# Patient Record
Sex: Male | Born: 1935 | Race: White | Hispanic: No | Marital: Married | Smoking: Former smoker
Health system: Southern US, Community
[De-identification: ages and names within clinical notes are randomized; demographics above are authoritative.]

## PROBLEM LIST (undated history)

## (undated) DIAGNOSIS — I4891 Unspecified atrial fibrillation: Secondary | ICD-10-CM

## (undated) DIAGNOSIS — I272 Pulmonary hypertension, unspecified: Secondary | ICD-10-CM

## (undated) DIAGNOSIS — R609 Edema, unspecified: Secondary | ICD-10-CM

## (undated) DIAGNOSIS — I350 Nonrheumatic aortic (valve) stenosis: Secondary | ICD-10-CM

## (undated) DIAGNOSIS — R9430 Abnormal result of cardiovascular function study, unspecified: Secondary | ICD-10-CM

## (undated) DIAGNOSIS — I340 Nonrheumatic mitral (valve) insufficiency: Secondary | ICD-10-CM

## (undated) DIAGNOSIS — IMO0002 Reserved for concepts with insufficient information to code with codable children: Secondary | ICD-10-CM

## (undated) DIAGNOSIS — E785 Hyperlipidemia, unspecified: Secondary | ICD-10-CM

## (undated) DIAGNOSIS — R55 Syncope and collapse: Secondary | ICD-10-CM

## (undated) DIAGNOSIS — R0989 Other specified symptoms and signs involving the circulatory and respiratory systems: Secondary | ICD-10-CM

## (undated) DIAGNOSIS — I447 Left bundle-branch block, unspecified: Secondary | ICD-10-CM

## (undated) DIAGNOSIS — I4892 Unspecified atrial flutter: Secondary | ICD-10-CM

## (undated) DIAGNOSIS — I10 Essential (primary) hypertension: Secondary | ICD-10-CM

## (undated) DIAGNOSIS — Z950 Presence of cardiac pacemaker: Secondary | ICD-10-CM

## (undated) DIAGNOSIS — H9190 Unspecified hearing loss, unspecified ear: Secondary | ICD-10-CM

## (undated) DIAGNOSIS — N189 Chronic kidney disease, unspecified: Secondary | ICD-10-CM

## (undated) DIAGNOSIS — Z9229 Personal history of other drug therapy: Secondary | ICD-10-CM

## (undated) DIAGNOSIS — E039 Hypothyroidism, unspecified: Secondary | ICD-10-CM

## (undated) DIAGNOSIS — I5022 Chronic systolic (congestive) heart failure: Secondary | ICD-10-CM

## (undated) HISTORY — DX: Nonrheumatic aortic (valve) stenosis: I35.0

## (undated) HISTORY — DX: Abnormal result of cardiovascular function study, unspecified: R94.30

## (undated) HISTORY — DX: Chronic kidney disease, unspecified: N18.9

## (undated) HISTORY — DX: Syncope and collapse: R55

## (undated) HISTORY — DX: Reserved for concepts with insufficient information to code with codable children: IMO0002

## (undated) HISTORY — DX: Pulmonary hypertension, unspecified: I27.20

## (undated) HISTORY — DX: Personal history of other drug therapy: Z92.29

## (undated) HISTORY — DX: Presence of cardiac pacemaker: Z95.0

## (undated) HISTORY — DX: Hyperlipidemia, unspecified: E78.5

## (undated) HISTORY — DX: Nonrheumatic mitral (valve) insufficiency: I34.0

## (undated) HISTORY — DX: Other specified symptoms and signs involving the circulatory and respiratory systems: R09.89

## (undated) HISTORY — DX: Edema, unspecified: R60.9

## (undated) HISTORY — DX: Chronic systolic (congestive) heart failure: I50.22

## (undated) HISTORY — DX: Essential (primary) hypertension: I10

## (undated) HISTORY — PX: INGUINAL HERNIA REPAIR: SUR1180

## (undated) HISTORY — PX: MASTOIDECTOMY: SHX711

## (undated) HISTORY — DX: Hypothyroidism, unspecified: E03.9

## (undated) HISTORY — DX: Left bundle-branch block, unspecified: I44.7

## (undated) HISTORY — DX: Unspecified atrial fibrillation: I48.91

## (undated) HISTORY — DX: Unspecified atrial flutter: I48.92

---
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
## 2001-12-30 ENCOUNTER — Encounter: Payer: Self-pay | Admitting: Family Medicine

## 2001-12-30 ENCOUNTER — Ambulatory Visit (HOSPITAL_COMMUNITY): Admission: RE | Admit: 2001-12-30 | Discharge: 2001-12-30 | Payer: Self-pay | Admitting: Family Medicine

## 2003-01-19 ENCOUNTER — Encounter: Payer: Self-pay | Admitting: Cardiology

## 2003-02-10 ENCOUNTER — Encounter: Payer: Self-pay | Admitting: Cardiology

## 2003-02-15 ENCOUNTER — Encounter: Payer: Self-pay | Admitting: Cardiology

## 2003-02-15 ENCOUNTER — Ambulatory Visit (HOSPITAL_COMMUNITY): Admission: RE | Admit: 2003-02-15 | Discharge: 2003-02-15 | Payer: Self-pay | Admitting: *Deleted

## 2003-02-16 ENCOUNTER — Encounter: Payer: Self-pay | Admitting: Cardiology

## 2003-03-03 ENCOUNTER — Ambulatory Visit (HOSPITAL_COMMUNITY): Admission: RE | Admit: 2003-03-03 | Discharge: 2003-03-03 | Payer: Self-pay | Admitting: Internal Medicine

## 2003-03-19 ENCOUNTER — Encounter (HOSPITAL_COMMUNITY): Admission: RE | Admit: 2003-03-19 | Discharge: 2003-04-18 | Payer: Self-pay | Admitting: Cardiology

## 2004-10-25 ENCOUNTER — Ambulatory Visit (HOSPITAL_COMMUNITY): Admission: RE | Admit: 2004-10-25 | Discharge: 2004-10-25 | Payer: Self-pay | Admitting: General Surgery

## 2007-09-01 ENCOUNTER — Ambulatory Visit (HOSPITAL_COMMUNITY): Admission: RE | Admit: 2007-09-01 | Discharge: 2007-09-01 | Payer: Self-pay | Admitting: Family Medicine

## 2007-09-08 ENCOUNTER — Ambulatory Visit: Payer: Self-pay | Admitting: Orthopedic Surgery

## 2007-09-08 DIAGNOSIS — M79609 Pain in unspecified limb: Secondary | ICD-10-CM | POA: Insufficient documentation

## 2010-05-05 ENCOUNTER — Encounter: Payer: Self-pay | Admitting: Cardiology

## 2010-05-05 ENCOUNTER — Ambulatory Visit (HOSPITAL_COMMUNITY): Admission: RE | Admit: 2010-05-05 | Discharge: 2010-05-05 | Payer: Self-pay | Admitting: Family Medicine

## 2010-05-09 ENCOUNTER — Encounter (INDEPENDENT_AMBULATORY_CARE_PROVIDER_SITE_OTHER): Payer: Self-pay | Admitting: Family Medicine

## 2010-05-09 ENCOUNTER — Ambulatory Visit (HOSPITAL_COMMUNITY): Admission: RE | Admit: 2010-05-09 | Discharge: 2010-05-09 | Payer: Self-pay | Admitting: Family Medicine

## 2010-05-09 ENCOUNTER — Ambulatory Visit: Payer: Self-pay | Admitting: Cardiology

## 2010-05-23 ENCOUNTER — Ambulatory Visit (HOSPITAL_COMMUNITY): Admission: RE | Admit: 2010-05-23 | Discharge: 2010-05-23 | Payer: Self-pay | Admitting: Family Medicine

## 2010-08-07 ENCOUNTER — Ambulatory Visit: Payer: Self-pay | Admitting: Cardiology

## 2010-08-07 ENCOUNTER — Encounter: Payer: Self-pay | Admitting: Physician Assistant

## 2010-08-07 DIAGNOSIS — E785 Hyperlipidemia, unspecified: Secondary | ICD-10-CM

## 2010-08-10 ENCOUNTER — Encounter (INDEPENDENT_AMBULATORY_CARE_PROVIDER_SITE_OTHER): Payer: Self-pay | Admitting: *Deleted

## 2010-09-06 ENCOUNTER — Ambulatory Visit: Payer: Self-pay | Admitting: Cardiology

## 2010-10-06 ENCOUNTER — Ambulatory Visit: Payer: Self-pay | Admitting: Cardiology

## 2010-10-06 ENCOUNTER — Encounter: Payer: Self-pay | Admitting: Cardiology

## 2010-10-13 ENCOUNTER — Encounter (INDEPENDENT_AMBULATORY_CARE_PROVIDER_SITE_OTHER): Payer: Self-pay | Admitting: *Deleted

## 2010-11-28 NOTE — Progress Notes (Signed)
Summary: Office Visit-DR. JEFFREY HARDEN  Office Visit-DR. JEFFREY HARDEN   Imported By: Zachary George 08/07/2010 10:46:55  _____________________________________________________________________  External Attachment:    Type:   Image     Comment:   External Document

## 2010-11-28 NOTE — Assessment & Plan Note (Signed)
Summary: 1 MO F/U PER 10/10 OV W/SERPE-JM   Visit Type:  Follow-up Primary Alonnie Bieker:  Dr Megan Mans  CC:  cardiomyopathy.  History of Present Illness: The patient is seen for followup of cardiomyopathy.  He was seen as a new patient on August 07, 2010. At that time he was noted that he had left bundle branch block.  Also he has an ejection fraction of 20%.  There is akinesis of the anteroseptal wall anterolateral wall and apex.  There is hypokinesis of the posterior lateral wall.  There was mild right ventricular dysfunction.  The patient was quite stable.  We decided to begin to adjust medications for his cardiomyopathy and evaluate him slowly over time.  Low-dose carvedilol was started.  He is doing well.  Also we checked labs.  Thyroid functions were normal.  BUN was 25 with creatinine 1.3 and an estimated GFR of 50.  Potassium is 4.3.  BNP was surprisingly low at 78  Preventive Screening-Counseling & Management  Alcohol-Tobacco     Smoking Status: quit     Year Quit: 1949  Current Medications (verified): 1)  Simvastatin 40 Mg Tabs (Simvastatin) .... Take 1 Tablet By Mouth Once A Day 2)  Furosemide 20 Mg Tabs (Furosemide) .... Take 1 Tablet By Mouth Once A Day 3)  Klor-Con M20 20 Meq Cr-Tabs (Potassium Chloride Crys Cr) .... Take 1 Tablet By Mouth Once A Day 4)  Tylenol 325 Mg Tabs (Acetaminophen) .... As Needed 5)  Aspirin 81 Mg Tbec (Aspirin) .... Take One Tablet By Mouth Daily 6)  Carvedilol 3.125 Mg Tabs (Carvedilol) .... Take One Tablet By Mouth Twice A Day  Allergies (verified): No Known Drug Allergies  Comments:  Nurse/Medical Assistant: The patient's medication list and allergies were reviewed with the patient and were updated in the Medication and Allergy Lists.  Past History:  Past Medical History: Hypertension Hyperlipidemia cardiomyopathy   EF 15-20%... echo... September, 2011 LBBB Edema  Social History: Smoking Status:  quit  Review of Systems   Patient denies fever, chills, headache, sweats, rash, change in vision, change in hearing, chest pain, cough, nausea vomiting, urinary symptoms.  All other systems are reviewed and are negative  Vital Signs:  Patient profile:   77 year old male Height:      66 inches Weight:      169 pounds Pulse rate:   69 / minute BP sitting:   124 / 76  (left arm) Cuff size:   regular  Vitals Entered By: Carlye Grippe (September 06, 2010 1:40 PM)  Physical Exam  General:  Patient looks good today. Eyes:  no xanthelasma. Neck:  no jugular venous distention. Lungs:  lungs are clear.  Respiratory effort is nonlabored. Heart:  cardiac exam reveals an S1-S2.  No clicks or significant murmurs. Abdomen:  abdomen is soft. Extremities:  trace peripheral edema. Psych:  patient is oriented to person time and place.  Affect is normal.   Impression & Recommendations:  Problem # 1:  EDEMA (ICD-782.3) Patient has trace edema today.  I will not change his diuretic.  Problem # 2:  HYPERTENSION (ICD-401.9)  His updated medication list for this problem includes:    Furosemide 20 Mg Tabs (Furosemide) .[name]... Take 1 tablet by mouth once a day    Aspirin 81 Mg Tbec (Aspirin) .[name]... Take one tablet by mouth daily    Carvedilol 3.125 Mg Tabs (Carvedilol) .[name]... Take one tablet by mouth twice a day    Altace 2.5 Mg Caps (  Ramipril) .[name]... Take 1 capsule by mouth once a day Blood pressure is controlled.  ACE Inhibitor we added for his LV dysfunction.  Problem # 3:  LEFT BUNDLE BRANCH BLOCK (ICD-426.3)  His updated medication list for this problem includes:    Aspirin 81 Mg Tbec (Aspirin) .[name]... Take one tablet by mouth daily    Carvedilol 3.125 Mg Tabs (Carvedilol) .[name]... Take one tablet by mouth twice a day    Altace 2.5 Mg Caps (Ramipril) .[name]... Take 1 capsule by mouth once a day Is possible that his left lateral planes a role with his left ventricular dysfunction.  Certainly it does not cause an EF of 20%.  Problem  # 4:  CHRONIC SYSTOLIC HEART FAILURE (ICD-428.22)  His updated medication list for this problem includes:    Furosemide 20 Mg Tabs (Furosemide) .[name]... Take 1 tablet by mouth once a day    Aspirin 81 Mg Tbec (Aspirin) .[name]... Take one tablet by mouth daily    Carvedilol 3.125 Mg Tabs (Carvedilol) .[name]... Take one tablet by mouth twice a day    Altace 2.5 Mg Caps (Ramipril) .[name]... Take 1 capsule by mouth once a day The patient's cardiopathy he is stable.  He is not having clinical significant heart failure.  We had a very low dose of carvedilol at the time of his last visit.  His renal function is stable enough to very low dose of an ACE inhibitor.  Because this is a new diagnosis since 2004 we have to consider coronary disease.  However the patient is a 77 years of age and is not having symptoms.  I will consider nuclear scan at a later date.  Patient Instructions: 1)  Follow up with Dr. [name] on Friday, October 06, 2010 at 1pm. 2)  Start Altace 2.5mg  by mouth once daily. Prescriptions: ALTACE 2.5 MG CAPS (RAMIPRIL) Take 1 capsule by mouth once a day  #30 x 6   Entered by:   Cyril Loosen, RN, BSN   Authorized by:   Talitha Givens, MD, [facility]   Signed by:   Cyril Loosen, RN, BSN on 09/06/2010   Method used:   Electronically to        [facility]   [address] ([facility]ail)       [address]       [place], [place]  [address]       Ph: [phone]       Fax: [phone]   RxID:   [id number]   Handout requested.

## 2010-11-28 NOTE — Letter (Signed)
Summary: Engineer, materials at [facility]  [address]   [place], [place] [address]   Phone: [phone]  Fax: [phone]        August 10, 2010 MRN: [id number]    [place] [address]    Dear Mr. Ranes,  Your test ordered by Selena Batten has been reviewed by your physician (or physician assistant) and was found to be normal or stable. Your physician (or physician assistant) felt no changes were needed at this time.  ____ Echocardiogram  ____ Cardiac Stress Test  _X___ Lab Work  ____ Peripheral vascular study of arms, legs or neck  ____ CT scan or X-ray  ____ Lung or Breathing test  ____ Other:   Thank you.   Cyril Loosen, RN, BSN    Duane Boston, M.D., F.A.C.C. Thressa Sheller, M.D., F.A.C.C. Oneal Grout, M.D., F.A.C.C. Cheree Ditto, M.D., F.A.C.C. Daiva Nakayama, M.D., F.A.C.C. Kenney Houseman, M.D., F.A.C.C. Jeanne Ivan, PA-C

## 2010-11-28 NOTE — Letter (Signed)
Summary: External Correspondence/ OFFICE VISIT DR. MCINNIS  External Correspondence/ OFFICE VISIT DR. MCINNIS   Imported By: Dorise Hiss 05/31/2010 11:49:38  _____________________________________________________________________  External Attachment:    Type:   Image     Comment:   External Document

## 2010-11-28 NOTE — Assessment & Plan Note (Signed)
Summary: np6/sob/edema/fatigue/mt   Visit Type:  Initial Consult Primary Provider:  Mc[facility]inness   History of Present Illness: patient presents to Dr. Willa Rough for initial consultation, following recent development of new onset peripheral edema and exertional dyspnea.  He presents with no documented history of CAD. He was briefly seen in our [place] clinic in 2004, when he presented with newly documented LBBB. 2-D echocardiography at that time suggested normal LVF, with no significant valvular abnormalities. An adenosine stress Cardiolite revealed normal perfusion; EF 65%.  Patient presents with cardiac risk factors notable for HTN, dyslipidemia, age, and remote tobacco smoking.  He was recently started on low-dose furosemide, with significant improvement in his edema. He states that his exertional symptoms improved, as well. He denies any history of exertional angina pectoris. He denies concomitant PND or orthopnea. Recent evaluation by CXR this past July indicated slight interstitial edema superimposed on emphysema and chronic long changes, such as idiopathic pulmonary fibrosis. Further evaluation with a high resolution CT scan was recommended.  A 2-D echo at that time, reviewed by Dr. Dietrich Pates, indicated severe LVD (EF 15-20%), and virtual AK of antero-septal/anterolateral and apical myocardium; moderate posterolateral wall HK; mild RV dysfunction; no significant valvular abnormalities.  Current Medications (verified): 1)  Simvastatin 40 Mg Tabs (Simvastatin) .... Take 1 Tablet By Mouth Once A Day 2)  Furosemide 20 Mg Tabs (Furosemide) .... Take 1 Tablet By Mouth Once A Day 3)  Klor-Con M20 20 Meq Cr-Tabs (Potassium Chloride Crys Cr) .... Take 1 Tablet By Mouth Once A Day 4)  Tylenol 325 Mg Tabs (Acetaminophen) .... As Needed  Allergies (verified): No Known Drug Allergies  Comments:  Nurse/Medical Assistant: The patient's medication list and allergies were reviewed with the  patient and were updated in the Medication and Allergy Lists.  Past History:  Past Medical History: Last updated: 08/07/2010 Hypertension Hyperlipidemia  Family History: noncontributory for premature CAD.  Social History: Retired from government quit smoking in 1952. Denies alcohol use.  Review of Systems       No fevers, chills, hemoptysis, dysphagia, melena, hematocheezia, hematuria, rash, claudication, orthopnea, pnd, pedal edema. denies tachycardia palpitations. All other systems negative.   Vital Signs:  Patient profile:   77 year old male Height:      66 inches Weight:      164 pounds BMI:     26.57 Pulse rate:   72 / minute BP sitting:   129 / 83  (left arm) Cuff size:   regular  Vitals Entered By: Carlye Grippe (August 07, 2010 2:14 PM)  Physical Exam  Additional Exam:  GEN: 77 year old male, sitting upright, in no distress HEENT: NCAT,PERRLA,EOMI NECK: palpable pulses, no bruits; no JVD; no TM LUNGS: bibasilar crackles, worse on right; no wheezes HEART: RRR (S1S2); no significant murmurs; no rubs; no S3 gallops ABD: soft, NT; intact BS EXT: intact distal pulses; 1+ pedal edema SKIN: warm, dry MUSC: no obvious deformity NEURO: A/O (x3)     EKG  Procedure date:  08/07/2010  Findings:      normal sinus rhythm at 83 bpm; LAD/LAH B.; chronic LBBB  Impression & Recommendations:  Problem # 1:  CHRONIC SYSTOLIC HEART FAILURE (ICD-428.22)  continue current low dose furosemide, given reported symptomatic improvement. We'll initiate medical therapy with carvedilol 3.125 mg b.i.d., and up titrate as blood pressure/heart rate allows. check baseline metabolic profile and BNP level. We'll consider adding low-dose ACE inhibitor at time of next office visit, pending review of renal function. Will start low  dose aspirin for primary prevention. Patient presents with newly documented severe LVD (EF  15-20%), and we will consider further evaluation, in the near  future, with perfusion imaging study for risk stratification, versus diagnostic coronary angiography to define coronary anatomy. Will schedule early clinic follow with myself and Dr. [name].  Problem # 2:  LEFT BUNDLE BRANCH BLOCK (ICD-426.3)  initially diagnosed in 2004. Subsequent normal nuclear imaging study.  Problem # 3:  HYPERTENSION (ICD-401.9) Assessment: Comment Only  Problem # 4:  DYSLIPIDEMIA (ICD-272.4) Assessment: Comment Only  Problem # 5:  EDEMA (ICD-782.3)  continue low dose furosemide  Other Orders: EKG w/ Interpretation ([address]) T-Basic Metabolic Panel ([address]) T-BNP  (B Natriuretic Peptide) ([address]-91478) T-TSH [phone])  Patient Instructions: 1)  Follow up with Dr. [name] on Wed, Sep 06, 2010 at 2pm. 2)  Your physician recommends that you go to the [facility] for lab work. 3)  Your physician discussed the risks, benefits and indications for preventive aspirin therapy. It is recommended that you start (or continue) taking 81 mg of aspirin a day. 4)  Start Coreg (Carvedilol) 3.125mg  by mouth two times a day. Prescriptions: CARVEDILOL 3.125 MG TABS (CARVEDILOL) Take one tablet by mouth twice a day  #60 x 6   Entered by:   Cyril Loosen, RN, BSN   Authorized by:   Nelida Meuse, PA-C   Signed by:   Cyril Loosen, RN, BSN on 08/07/2010   Method used:   Electronically to        [facility]  [place] [address] ([address]ail)       [address]       [place], [place]  [address]       Ph: [phone]       Fax: [phone]   RxID:   [id number]   Handout requested.  I have reviewed and approved all prescriptions at the time of the office visit. Nelida Meuse, PA-C  August 07, 2010 3:36 PM

## 2010-11-30 NOTE — Assessment & Plan Note (Signed)
Summary: 1 MO F/U PER REMINDER-JM   Visit Type:  Follow-up Primary Provider:  Dr Megan Mans  CC:  cardiomyopathy.  History of Present Illness: The patient is seen for followup of cardiomyopathy.  He has left ventricular dysfunction.  Up to this point I have not pushed for ischemia workup.  I have been adjusting his medicines very carefully.  I saw him last night, 011.  At that time we started a small does of altace. He is tolerating this well.  Preventive Screening-Counseling & Management  Alcohol-Tobacco     Smoking Status: quit     Year Quit: 1951  Current Medications (verified): 1)  Simvastatin 40 Mg Tabs (Simvastatin) .... Take 1 Tablet By Mouth Once A Day 2)  Furosemide 20 Mg Tabs (Furosemide) .... Take 1 Tablet By Mouth Once A Day 3)  Klor-Con M20 20 Meq Cr-Tabs (Potassium Chloride Crys Cr) .... Take 1 Tablet By Mouth Once A Day 4)  Tylenol 325 Mg Tabs (Acetaminophen) .... As Needed 5)  Aspirin 81 Mg Tbec (Aspirin) .... Take One Tablet By Mouth Daily 6)  Carvedilol 3.125 Mg Tabs (Carvedilol) .... Take One Tablet By Mouth Twice A Day 7)  Altace 2.5 Mg Caps (Ramipril) .... Take 1 Capsule By Mouth Once A Day  Allergies (verified): No Known Drug Allergies  Comments:  Nurse/Medical Assistant: The patient's medication list and allergies were reviewed with the patient and were updated in the Medication and Allergy Lists.  Past History:  Past Medical History: Hypertension Hyperlipidemia cardiomyopathy   EF 15-20%... echo... September, 2011 LBBB Edema .[name]  Review of Systems       Patient denies fever, chills, headache, sweats, rash, change in vision, change in hearing, chest pain, cough, nausea vomiting, urinary symptoms.  All the systems are reviewed and are negative.  Vital Signs:  Patient profile:   77 year old male Height:      66 inches Weight:      166 pounds Pulse rate:   50 / minute BP sitting:   131 / 78  (left arm) Cuff size:   regular  Vitals Entered By:  Carlye Grippe (October 06, 2010 12:47 PM)  Physical Exam  General:  he looks great today. Eyes:  no xanthelasma. Neck:  no jugular venous distention. Lungs:  lungs are clear.  Respiratory effort is nonlabored. Heart:  cardiac exam reveals an S1-S2.  No clicks or significant murmurs. Abdomen:  abdomen soft. Extremities:  no peripheral edema. Psych:  patient is oriented to person time and place.  Affect is normal.   Impression & Recommendations:  Problem # 1:  EDEMA (ICD-782.3) No edema today.  No change in therapy.  Problem # 2:  HYPERTENSION (ICD-401.9)  His updated medication list for this problem includes:    Furosemide 20 Mg Tabs (Furosemide) .[name]... Take 1 tablet by mouth once a day    Aspirin 81 Mg Tbec (Aspirin) .[name]... Take one tablet by mouth daily    Carvedilol 3.125 Mg Tabs (Carvedilol) .[name]... Take one tablet by mouth twice a day    Altace 5 Mg Caps (Ramipril) .[name]... Take 1 tablet by mouth once a day Blood pressure is well-controlled.  We will be increasing his Altace 4 CHF  Orders: T-Basic Metabolic Panel [phone])  Problem # 3:  CHRONIC SYSTOLIC HEART FAILURE (ICD-428.22)  His updated medication list for this problem includes:    Furosemide 20 Mg Tabs (Furosemide) .[name]... Take 1 tablet by mouth once a day    Aspirin 81 Mg  Tbec (Aspirin) .[name]... Take one tablet by mouth daily    Carvedilol 3.125 Mg Tabs (Carvedilol) .[name]... Take one tablet by mouth twice a day    Altace 5 Mg Caps (Ramipril) .[name]... Take 1 tablet by mouth once a day Clinically he is stable.  We will titrate up the dose of Altace.  His renal function will be checked today.  We'll see him back in several weeks.  Patient Instructions: 1)  Follow up with Dr. [name] on Friday, December 15, 2010 at 1:45pm. 2)  Increase Altace (ramipril) to 5mg  by mouth once daily. 3)  Your physician recommends that you go to the [facility] for lab work: DO TODAY. Prescriptions: ALTACE 5 MG CAPS (RAMIPRIL) Take 1 tablet by  mouth once a day  #30 x 6   Entered by:   Cyril Loosen, RN, BSN   Authorized by:   Talitha Givens, MD, [facility]   Signed by:   Cyril Loosen, RN, BSN on 10/06/2010   Method used:   Electronically to        [facility]  [address] (retail)       [address] [place] [address]       [place], [place]  [address]       Ph: [phone]       Fax: [phone]   RxID:   [id number]

## 2010-11-30 NOTE — Letter (Signed)
Summary: Engineer, materials at [facility]  [address]   [place], [place] [address]   Phone: [phone]  Fax: [phone]        October 13, 2010 MRN: [id number]   Javier Richards [address] [place], [place]  [address]    Dear [name],  Your test ordered by Selena Batten has been reviewed by your physician (or physician assistant) and was found to be normal or stable. Your physician (or physician assistant) felt no changes were needed at this time.  ____ Echocardiogram  ____ Cardiac Stress Test  _X___ Lab Work  ____ Peripheral vascular study of arms, legs or neck  ____ CT scan or X-ray  ____ Lung or Breathing test  ____ Other:   Thank you.   Cyril Loosen, RN, BSN    Duane Boston, M.D., F.A.C.C. Thressa Sheller, M.D., F.A.C.C. Oneal Grout, M.D., F.A.C.C. Cheree Ditto, M.D., F.A.C.C. Daiva Nakayama, M.D., F.A.C.C. Kenney Houseman, M.D., F.A.C.C. Jeanne Ivan, PA-C

## 2010-12-01 NOTE — Progress Notes (Signed)
Summary: Office Visit-dR. jEFFREY hARDEN  Office Visit-dR. jEFFREY hARDEN   Imported By: Zachary George 08/07/2010 10:47:51  _____________________________________________________________________  External Attachment:    Type:   Image     Comment:   External Document

## 2010-12-13 ENCOUNTER — Encounter: Payer: Self-pay | Admitting: Physician Assistant

## 2010-12-15 ENCOUNTER — Encounter: Payer: Self-pay | Admitting: Cardiology

## 2010-12-15 ENCOUNTER — Ambulatory Visit (INDEPENDENT_AMBULATORY_CARE_PROVIDER_SITE_OTHER): Payer: Medicare Other | Admitting: Cardiology

## 2010-12-15 DIAGNOSIS — I428 Other cardiomyopathies: Secondary | ICD-10-CM

## 2010-12-15 DIAGNOSIS — I6529 Occlusion and stenosis of unspecified carotid artery: Secondary | ICD-10-CM

## 2010-12-19 ENCOUNTER — Encounter: Payer: Self-pay | Admitting: Cardiology

## 2010-12-20 NOTE — Medication Information (Signed)
Summary: RX Investment banker, operational DRUG INFO  RX Folder/ [facility]   Imported By: Dorise Hiss 12/13/2010 08:30:24  _____________________________________________________________________  External Attachment:    Type:   Image     Comment:   External Document

## 2010-12-20 NOTE — Assessment & Plan Note (Addendum)
Summary: 2 MO F/U PER 12/9 OV-JM/TR   Visit Type:  Follow-up Primary Provider:  Dr Megan Mans  CC:  cardiopathy.  History of Present Illness: The patient is seen for followup of cardiomyopathy..  He's doing very well.  I saw him last December, 2011.  On that day we increased his Altace dose.  He is doing well with this.  Labs checked that day revealed a BUN of 26 and creatinine 1.1 with GFR greater than 60.  This is excellent for him.  Preventive Screening-Counseling & Management  Alcohol-Tobacco     Smoking Status: quit     Year Quit: 1949  Current Medications (verified): 1)  Simvastatin 40 Mg Tabs (Simvastatin) .... Take 1 Tablet By Mouth Once A Day 2)  Furosemide 20 Mg Tabs (Furosemide) .... Take 1 Tablet By Mouth Once A Day 3)  Klor-Con M20 20 Meq Cr-Tabs (Potassium Chloride Crys Cr) .... Take 1 Tablet By Mouth Once A Day 4)  Tylenol 325 Mg Tabs (Acetaminophen) .... As Needed 5)  Aspirin 81 Mg Tbec (Aspirin) .... Take One Tablet By Mouth Daily 6)  Carvedilol 3.125 Mg Tabs (Carvedilol) .... Take One Tablet By Mouth Twice A Day 7)  Altace 5 Mg Caps (Ramipril) .... Take 1 Tablet By Mouth Once A Day  Allergies (verified): No Known Drug Allergies  Comments:  Nurse/Medical Assistant: The patient's medication list and allergies were reviewed with the patient and were updated in the Medication and Allergy Lists.  Past History:  Past Medical History: Hypertension Hyperlipidemia cardiomyopathy   EF 15-20%... echo... September, 2011 LBBB Edema.. Carotid bruit   right... February, 201 to  Review of Systems       Patient denies fever, chills, headache, sweats, rash, change in vision, change in hearing, chest pain, cough, nausea vomiting, urinary symptoms.  All other systems are reviewed and are negative.  Vital Signs:  Patient profile:   77 year old male Height:      66 inches Weight:      165 pounds Pulse rate:   70 / minute BP sitting:   141 / 79  (left arm) Cuff size:    regular  Vitals Entered By: Carlye Grippe (December 15, 2010 1:54 PM)  Physical Exam  General:  patient is quite stable. Head:  head is atraumatic. Eyes:  no xanthelasma. Neck:  no jugular distention. there is a soft right carotid bruit. Chest Wall:  no chest wall tenderness. Lungs:  lungs are clear.  Respiratory effort is unlabored. Heart:  cardiac exam reveals S1-S2 present no clicks or significant murmurs. Abdomen:  abdomen soft. Msk:  no musculoskeletal deformities. Extremities:  no peripheral edema. Skin:  no skin rashes. Psych:  patient is oriented to person time and place.  Affect is normal.   Impression & Recommendations:  Problem # 1:  * CAROTID BRUIT There is a soft carotid bruit.  I reviewed the records carefully and there is no carotid Doppler documented.  This will be ordered and we'll be in touch with the information.  Problem # 2:  EDEMA (ICD-782.3) He has no edema today.  No change in his diuretic dose.  Problem # 3:  DYSLIPIDEMIA (ICD-272.4)  His updated medication list for this problem includes:    Simvastatin 40 Mg Tabs (Simvastatin) .[name]... Take 1 tablet by mouth once a day Lipids are being treated.  No change in therapy.  Problem # 4:  HYPERTENSION (ICD-401.9) Blood pressure is controlled.  No change in therapy.  Problem # 5:  CHRONIC SYSTOLIC HEART FAILURE (ICD-428.22) Is time to further titrate his medications.  Carvedilol will be increased to 6.25 mg b.i.d.  I will then see him for followup.  Other Orders: Carotid Duplex (Carotid Duplex)  Patient Instructions: 1)  Follow up with Dr. [name] on  Tuesday, January 30, 2011 at 10:45am. 2)  Your physician has requested that you have a carotid duplex. This test is an ultrasound of the carotid arteries in your neck. It looks at blood flow through these arteries that supply the brain with blood. Allow one hour for this exam. There are no restrictions or special instructions. 3)  Increase Coreg (carvedilol) to  6.25mg  by mouth two times a day. You may take 2 of your 3.125mg  tablets two times a day until gone and then get new prescription filled for 6.25mg  tablets. Prescriptions: CARVEDILOL 6.25 MG TABS (CARVEDILOL) Take one tablet by mouth twice a day  #60 x 6   Entered by:   Cyril Loosen, RN, BSN   Authorized by:   Talitha Givens, MD, [facility]   Signed by:   Cyril Loosen, RN, BSN on 12/15/2010   Method used:   Electronically to        [facility]  [address] [place] [address] (retail)       [address]       [place], [place]  [address]       Ph: [phone]       Fax: [phone]   RxID:   [id number]   Appended Document: 2 MO F/U PER 12/9 OV-JM/TR Let him know mild narrowing. No concern, follow over time.

## 2011-01-29 ENCOUNTER — Encounter: Payer: Self-pay | Admitting: *Deleted

## 2011-01-29 DIAGNOSIS — R0989 Other specified symptoms and signs involving the circulatory and respiratory systems: Secondary | ICD-10-CM | POA: Insufficient documentation

## 2011-01-29 DIAGNOSIS — I10 Essential (primary) hypertension: Secondary | ICD-10-CM | POA: Insufficient documentation

## 2011-01-29 DIAGNOSIS — R609 Edema, unspecified: Secondary | ICD-10-CM | POA: Insufficient documentation

## 2011-01-29 DIAGNOSIS — I447 Left bundle-branch block, unspecified: Secondary | ICD-10-CM | POA: Insufficient documentation

## 2011-01-29 DIAGNOSIS — I429 Cardiomyopathy, unspecified: Secondary | ICD-10-CM | POA: Insufficient documentation

## 2011-01-30 ENCOUNTER — Ambulatory Visit (INDEPENDENT_AMBULATORY_CARE_PROVIDER_SITE_OTHER): Payer: Medicare Other | Admitting: Cardiology

## 2011-01-30 ENCOUNTER — Encounter: Payer: Self-pay | Admitting: Cardiology

## 2011-01-30 DIAGNOSIS — I428 Other cardiomyopathies: Secondary | ICD-10-CM

## 2011-01-30 DIAGNOSIS — R0989 Other specified symptoms and signs involving the circulatory and respiratory systems: Secondary | ICD-10-CM

## 2011-01-30 DIAGNOSIS — I10 Essential (primary) hypertension: Secondary | ICD-10-CM

## 2011-01-30 DIAGNOSIS — R609 Edema, unspecified: Secondary | ICD-10-CM

## 2011-01-30 NOTE — Assessment & Plan Note (Signed)
Patient looks great.  I am not inclined to change his medicines any further.  I will consider a followup echo at some point.

## 2011-01-30 NOTE — Assessment & Plan Note (Signed)
Carotid Doppler looks great.  No further workup is needed.

## 2011-01-30 NOTE — Assessment & Plan Note (Signed)
Blood pressure is under good control. No change in therapy 

## 2011-01-30 NOTE — Patient Instructions (Signed)
   Your physician wants you to follow-up in: 3 months. You will receive a reminder letter in the mail one-two months in advance. If you don't receive a letter, please call our office to schedule the follow-up appointment.  Your physician recommends that you continue on your current medications as directed. Please refer to the Current Medication list given to you today.  

## 2011-01-30 NOTE — Assessment & Plan Note (Signed)
He is not having any significant edema.  He is stable.  No change in therapy.

## 2011-01-30 NOTE — Progress Notes (Signed)
HPI Patient is seen today for followup cardiomyopathy.  He looks great.  He is fully active.  I saw him last on December 15, 2010.  At that time there was questionable carotid bruit.  He did have a carotid Doppler and looked quite good.  No further workup is needed.  He is not having any edema.  There is no shortness of breath.   No Known Allergies  Current Outpatient Prescriptions  Medication Sig Dispense Refill  . acetaminophen (TYLENOL) 325 MG tablet Take 650 mg by mouth every 6 (six) hours as needed.        [name] aspirin 81 MG tablet Take 81 mg by mouth daily.        . carvedilol (COREG) 6.25 MG tablet Take 6.25 mg by mouth 2 (two) times daily.        . furosemide (LASIX) 20 MG tablet Take 20 mg by mouth daily.        . potassium chloride SA (K-DUR,KLOR-CON) 20 MEQ tablet Take 20 mEq by mouth daily.        . ramipril (ALTACE) 5 MG tablet Take 5 mg by mouth daily.        . simvastatin (ZOCOR) 40 MG tablet Take 40 mg by mouth at bedtime.          History   Social History  . Marital Status: Married    Spouse Name: N/A    Number of Children: N/A  . Years of Education: N/A   Occupational History  . Retired     Printmaker   Social History Main Topics  . Smoking status: Former Smoker    Quit date: 10/29/1949  . Smokeless tobacco: Not on file  . Alcohol Use: No  . Drug Use: Not on file  . Sexually Active: Not on file   Other Topics Concern  . Not on file   Social History Narrative  . No narrative on file    No family history on file.  Past Medical History  Diagnosis Date  . Hypertension   . Hyperlipemia   . Cardiomyopathy     EF 15-20%... echo... September, 2011  . LBBB (left bundle branch block)   . Edema   . Carotid bruit     Right 11/2010-Doppler: Mild plaque formation (B) w/o hemodynamically significant ICA stenosis, Mild to Moderate (L) ECA Stenosis    Past Surgical History  Procedure Date  . Mastectomy   . Inguinal hernia repair     Left    ROS  Patient  denies fever, Chills, headache, sweats, rash, change in vision, change in hearing, chest pain, cough, nausea vomiting, urinary symptoms.  All of the systems are reviewed and are negative. PHYSICAL EXAM He looks great today.  Patient is oriented to person time and place.  Affect is normal.  Head reveals no xanthelasma.  There is no carotid bruit.  Lungs are clear.  Respiratory effort is unlabored.  Cardiac exam reveals S1-S2.  There are no clicks or significant murmurs.  The abdomen is soft.  No peripheral edema. Filed Vitals:   01/30/11 1117  BP: 132/86  Pulse: 50  Height: 5\' 6"  (1.676 m)  Weight: 168 lb (76.204 kg)    EKG  No EKG is done today.  ASSESSMENT & PLAN

## 2011-03-01 ENCOUNTER — Other Ambulatory Visit (HOSPITAL_COMMUNITY): Payer: Self-pay | Admitting: Family Medicine

## 2011-03-01 ENCOUNTER — Ambulatory Visit (HOSPITAL_COMMUNITY)
Admission: RE | Admit: 2011-03-01 | Discharge: 2011-03-01 | Disposition: A | Discharge status: Home / Self Care | Payer: Medicare Other | Source: Ambulatory Visit | Attending: Family Medicine | Admitting: Family Medicine

## 2011-03-01 DIAGNOSIS — R0602 Shortness of breath: Secondary | ICD-10-CM | POA: Insufficient documentation

## 2011-03-01 DIAGNOSIS — I509 Heart failure, unspecified: Secondary | ICD-10-CM

## 2011-03-05 ENCOUNTER — Telehealth: Payer: Self-pay | Admitting: Cardiology

## 2011-03-05 DIAGNOSIS — R609 Edema, unspecified: Secondary | ICD-10-CM

## 2011-03-05 NOTE — Telephone Encounter (Signed)
Pt states last week his (L) ankle was swollen. He saw his primary MD who did some blood work and increased his furosemide to 40mg . He states the swelling has gone down some. He states primary MD wanted pt to call the office to see if he needs to be seen sooner. He states he feels all right and his ankle has gone down since increasing the Lasix.   Pt offered first available appt or to have note sent to Dr. [name] for further review. Pt states he prefers a note be sent to Dr. [name].

## 2011-03-05 NOTE — Telephone Encounter (Signed)
Pt needs to talk with nurse re possibly needing to be seen sooner per pcp

## 2011-03-06 ENCOUNTER — Encounter: Payer: Self-pay | Admitting: Cardiology

## 2011-03-06 NOTE — Telephone Encounter (Signed)
The patient should stay on the higher dose of Lasix. Please arrange for a BMet  After the patient has been on the new dose for a week. Since he is feeling okay make a followup with me over the next several weeks.

## 2011-03-08 NOTE — Telephone Encounter (Signed)
Addended by: Carlye Grippe on: 03/08/2011 09:35 AM   Modules accepted: Orders

## 2011-03-08 NOTE — Telephone Encounter (Signed)
Patient informed of the below information and lab order sent to [facility]. Appt scheduled with [name] for June 18th 2012 at 10:15am

## 2011-03-13 ENCOUNTER — Telehealth: Payer: Self-pay | Admitting: *Deleted

## 2011-03-13 NOTE — Telephone Encounter (Signed)
Pt notified of results of labs and verbalized understanding.  

## 2011-03-16 NOTE — Procedures (Signed)
   NAME:  Javier Richards, Javier Richards                             ACCOUNT NO.:  [id number]   MEDICAL RECORD NO.:  [id number]                   PATIENT TYPE:  OUT   LOCATION:  RAD                                  FACILITY:  [facility]   PHYSICIAN:  Vida Roller, M.D.                DATE OF BIRTH:  [birth year]   DATE OF PROCEDURE:  02/15/2003  DATE OF DISCHARGE:                                  ECHOCARDIOGRAM   TAPE NUMBER:  [id number]   TAPE COUNT:  [id number]   CLINICAL INFORMATION:  The patient is a 77 year old gentleman with a newly  discovered left bundle branch block.  Technical quality of the study is  suboptimal.   PREVIOUS STUDY:  @@   M-MODE:  AORTA:  35 mm  (<4.0)  LEFT ATRIUM:  40 mm  (<4.0)  SEPTUM:  12 mm  (0.7-1.1)  POSTERIOR WALL:  12 mm  (0.7-1.1)  LV-DIASTOLE:  30 mm  (<5.7)  LV-SYSTOLE:  23 mm  (<4.0)  E-SEPTAL:  @@  (<0.5)  RV-DIASTOLE:  @@  (<2.5)  IVC:  @@  (<2.0)   TWO-DIMENSIONAL AND DOPPLER IMAGING:  The ventricle appears to have normal  systolic function.  Assessment of wall motion abnormalities is beyond the  limits of the study.  The septum does appear to move in a left bundle branch  block pattern with mild dyskinesis.  Overall diastolic function was not  assessed.   The right ventricle is normal size with normal systolic function.   Both atria appear to be borderline enlarged though not well seen.  Valvular  architecture was not visualized but there appeared to be no significant  aortic stenosis or mitral regurgitation.                                               Vida Roller, M.D.    JH/MEDQ  D:  02/15/2003  T:  02/16/2003  Job:  [id number]

## 2011-03-16 NOTE — Op Note (Signed)
NAMEROBEY, MASSMANN                   ACCOUNT NO.:  [id number]   MEDICAL RECORD NO.:  [id number]          PATIENT TYPE:  AMB   LOCATION:  DAY                           FACILITY:  [facility]   PHYSICIAN:  Dalia Heading, M.D.  DATE OF BIRTH:  1921-08-02   DATE OF PROCEDURE:  10/25/2004  DATE OF DISCHARGE:                                 OPERATIVE REPORT   PREOPERATIVE DIAGNOSIS:  Left inguinal hernia.   POSTOPERATIVE DIAGNOSIS:  Left inguinal hernia, direct.   PROCEDURE:  Left inguinal herniorrhaphy.   SURGEON:  Dalia Heading, M.D.   ANESTHESIA:  Spinal.   INDICATIONS:  The patient is an 77 year old white male who presents with a  symptomatic left inguinal hernia.  The risks and benefits of the procedure  including bleeding, infection, pain, and recurrence of the hernia were fully  explained to the patient, who gave informed consent   DESCRIPTION OF PROCEDURE:  The patient was placed in the supine position  after spinal anesthesia was administered.  The left groin region was prepped  and draped using the usual sterile technique with Betadine.  Surgical site  confirmation was performed.   A transverse incision was made in the left groin region down to the external  oblique aponeurosis.  The aponeurosis was incised to the external ring.  A  Penrose drain was placed around the spermatic cord.  The vas deferens was  noted within the spermatic cord.  A direct hernia sac was found.  This was  freed away from the spermatic cord and the base of it was incised.  It was  ten inverted, and a large sized polypropylene Mesh plug was then placed, in  this region, secured circumferentially to the transversalis fascia using 2-0  Novofil interrupted sutures.   On [place] polypropylene mesh patch was then placed along the floor of the  inguinal canal and secured superiorly to the conjoined tendon and inferiorly  to the shelving edge of Poupart's ligament using a 2-0 Novofil interrupted  suture.  The  internal ring was recreated using a 2-0 Novofil interrupted  suture.  The external oblique aponeurosis was reapproximated using a 2-0  Vicryl running suture.  The subcutaneous layer was reapproximated using a 3-  0 Vicryl interrupted suture.  The skin was closed using a 4-0 Vicryl  subcuticular suture  Plain __________ and Sensorcaine was instilled into the  surrounding wound.  Dermabond was then applied to the wound.   All tape and needle counts were correct at the end of the procedure.  The  patient was transferred back to PACU in stable condition.   COMPLICATIONS:  None.   SPECIMENS:  None.   BLOOD LOSS:  MinimalLoraine Leriche   MAJ/MEDQ  D:  10/25/2004  T:  10/25/2004  Job:  [id number]   cc:   Dalia Heading, M.D.  [address] [place]  [place] [address]  Fax: [phone]   Angus G. Renard Matter, MD  [address]  [place]  [place] [address]  Fax: [phone]

## 2011-03-16 NOTE — H&P (Signed)
NAMEBLAZE, NYLUND NO.:  [id number]   MEDICAL RECORD NO.:  [id number]           PATIENT TYPE:   LOCATION:                                 FACILITY:   PHYSICIAN:  Dalia Heading, M.D.  DATE OF BIRTH:  Jun 08, 1921   DATE OF ADMISSION:  DATE OF DISCHARGE:  LH                                HISTORY & PHYSICAL   CHIEF COMPLAINT:  Left inguinal hernia.   HISTORY OF PRESENT ILLNESS:  The patient is an 77 year old white male who  was referred for evaluation and treatment of a left inguinal hernia.  This  has been present for many months and seems to be increasing in size.  It is  made worse with straining.   PAST MEDICAL HISTORY:  Includes high cholesterol levels.   PAST SURGICAL HISTORY:  Mastectomy in the remote past.   CURRENT MEDICATIONS:  A cholesterol pill.   ALLERGIES:  No known drug allergies.   REVIEW OF SYMPTOMS:  Noncontributory.   PHYSICAL EXAMINATION:  GENERAL:  The patient is a well-developed, well-  nourished white male in no acute distress.  VITAL SIGNS:  Patient is afebrile and vital signs are stable.  LUNGS:  Clear to auscultation with equal breath sounds bilaterally.  HEART:  Examination reveals regular rate and rhythm without S3, S4 or  murmurs.  ABDOMEN:  Soft, nontender, nondistended.  No hepatosplenomegaly or masses  are noted.  A reducible left inguinal hernia is noted.  No right inguinal  hernia is noted.  GENITOURINARY:  Examination is within normal limits.   IMPRESSION:  Left inguinal hernia.   PLAN:  The patient is scheduled for a left inguinal herniorrhaphy on  October 25, 2004.  The risks and benefits of the procedure including  bleeding, infection, pain and possibility of recurrence of the hernia were  fully explained to the patient, who gave informed consent.     Mark   MAJ/MEDQ  D:  10/19/2004  T:  10/19/2004  Job:  [id number]   cc:   Angus G. Renard Matter, MD  [address]  [place]  [place] [address]  Fax:  [phone]

## 2011-03-16 NOTE — Procedures (Signed)
   NAME:  VIRAAJ, Javier Richards                             ACCOUNT NO.:  [id number]   MEDICAL RECORD NO.:  [id number]                   PATIENT TYPE:  OUT   LOCATION:  DFTL                                 FACILITY:  [facility]   PHYSICIAN:  Edward L. Juanetta Gosling, M.D.             DATE OF BIRTH:  07-Jul-1921   DATE OF PROCEDURE:  DATE OF DISCHARGE:                                    STRESS TEST   PRIMARY CARE PHYSICIAN:  Patient of Dr. Renard Matter   PROCEDURE:  Exercise stress test.   DISCUSSION:  Mr. Littler came to the Stress Lab at [facility] for a  stress test but was found to have a left bundle branch block on his resting  EKG which makes interpretation of the stress EKG problematic.  I have told  this to Mr. Sibal and he wants to discuss all of this with Dr. Renard Matter.  If  he needs to have a stress test done he should have a Cardiolite stress test.                                               Ramon Dredge L. Juanetta Gosling, M.D.    ELH/MEDQ  D:  01/19/2003  T:  01/19/2003  Job:  [id number]   cc:   Angus G. Renard Matter, M.D.  [address]  [place]  [place] [address]  Fax: [phone]

## 2011-03-16 NOTE — Op Note (Signed)
NAME:  SVEN, PINHEIRO                             ACCOUNT NO.:  [id number]   MEDICAL RECORD NO.:  [id number]                   PATIENT TYPE:  AMB   LOCATION:  DAY                                  FACILITY:  [facility]   PHYSICIAN:  R. Roetta Sessions, M.D.              DATE OF BIRTH:  1921/08/06   DATE OF PROCEDURE:  03/03/2003  DATE OF DISCHARGE:                                 OPERATIVE REPORT   PROCEDURE:  Colonoscopy with biopsy.   ENDOSCOPIST:  Gerrit Friends. Rourk, M.D.   INDICATIONS FOR PROCEDURE:  The patient is a 77 year old gentleman referred  over at the courtesy of Dr. Butch Penny for colorectal cancer screening.  He has never had his lower GI tract imaged.  No family history of colorectal  neoplasia and he is devoid of any lower GI tract symptoms. Colonoscopy is  now being done as a standard screening maneuver.  This approach has been  discussed with the patient.  The potential risks, benefits, and alternatives  have been reviewed, questions answered.  He is agreeable.  Please see my  handwritten H&P for more information.   MONITORING:  O2 saturation, blood pressure, pulse and respirations were  monitored throughout the entire procedure.  Conscious sedation: Versed 2  mg IV, Demerol 25 mg in divided doses.  The  patient had asymptomatic bradycardia in the 40s during the initial phases of  the procedure which responded to Atropine 0.25 mg IV.   INSTRUMENT:  Olympus video chip adult colonoscope.   FINDINGS:  Digital rectal exam revealed no abnormalities.   ENDOSCOPIC FINDINGS:  The prep was good.   RECTUM:  Examination of the rectal mucosa including the retroflex view of  the anal verge revealed no abnormalities.   COLON:  The colonic mucosa was surveyed from the rectosigmoid junction  through the left transverse and right colon to the area of the appendiceal  orifice, ileocecal valve, and cecum.  These structures were well seen and  photographed for the record.  The patient  had a long tortuous colon;  however, the mucosa appeared entirely normal except for a 5-mm polyp at the  hepatic flexure and another one in the cecum.  Both of these were cold  biopsied.  The cecum, ileocecal valve, and appendiceal orifice were well  seen and photographed for the record.   From this level, the scope was slowly withdrawn.  All previously mentioned  mucosal surfaces were again seen.  No other abnormalities were observed.  The patient tolerated the procedure well and was reacted in endoscopy.   IMPRESSION:  1. Normal rectum.  2. A long tortuous colon.  3. Diminutive polyps of the hepatic flexure and cecum, cold     biopsied/removed. The remainder of the colonic mucosa appeared normal.   RECOMMENDATIONS:  1. Follow up on path.  2. Further recommendations to follow.  Jonathon Bellows, M.D.    RMR/MEDQ  D:  03/03/2003  T:  03/03/2003  Job:  [id number]   cc:   Angus G. Renard Matter, M.D.  [address]  [place]  [place] [address]  Fax: [phone]

## 2011-04-16 ENCOUNTER — Encounter: Payer: Self-pay | Admitting: Cardiology

## 2011-04-16 ENCOUNTER — Ambulatory Visit (INDEPENDENT_AMBULATORY_CARE_PROVIDER_SITE_OTHER): Payer: Medicare Other | Admitting: Cardiology

## 2011-04-16 DIAGNOSIS — I10 Essential (primary) hypertension: Secondary | ICD-10-CM

## 2011-04-16 DIAGNOSIS — I5022 Chronic systolic (congestive) heart failure: Secondary | ICD-10-CM

## 2011-04-16 NOTE — Progress Notes (Signed)
HPI Patient is seen for cardiology followup.  Is really doing very well.  Labs in May, [address] show his potassium was 5.1.  Potassium was stopped.  His diuretic dose has been increased slightly.  He is feeling well.  He is not having chest pain or signs of heart failure. No Known Allergies  Current Outpatient Prescriptions  Medication Sig Dispense Refill  . acetaminophen (TYLENOL) 325 MG tablet Take 650 mg by mouth every 6 (six) hours as needed.        [name] aspirin 81 MG tablet Take 81 mg by mouth daily.        . carvedilol (COREG) 6.25 MG tablet Take 6.25 mg by mouth 2 (two) times daily.        . furosemide (LASIX) 20 MG tablet Take 20 mg by mouth daily.        . ramipril (ALTACE) 5 MG tablet Take 5 mg by mouth daily.        . simvastatin (ZOCOR) 40 MG tablet Take 40 mg by mouth at bedtime.          History   Social History  . Marital Status: Married    Spouse Name: N/A    Number of Children: N/A  . Years of Education: N/A   Occupational History  . Retired     Printmaker   Social History Main Topics  . Smoking status: Former Smoker -- 1.5 packs/day for 10 years    Types: Cigarettes    Quit date: 10/29/1949  . Smokeless tobacco: Never Used  . Alcohol Use: No  . Drug Use: Not on file  . Sexually Active: Not on file   Other Topics Concern  . Not on file   Social History Narrative  . No narrative on file    No family history on file.  Past Medical History  Diagnosis Date  . Hypertension   . Hyperlipemia   . Cardiomyopathy     EF 15-20%... echo... September, 2011  . LBBB (left bundle branch block)   . Edema   . Carotid bruit     Right 11/2010-Doppler: Mild plaque formation (B) w/o hemodynamically significant ICA stenosis, Mild to Moderate (L) ECA Stenosis    Past Surgical History  Procedure Date  . Mastectomy   . Inguinal hernia repair     Left    ROS  Patient denies fever, chills, headache, sweats, rash, change in vision, change in hearing, chest pain, cough,  nausea vomiting, urinary symptoms.  All the systems are reviewed and are negative.  PHYSICAL EXAM Patient is oriented to person time and place.  Affect is normal.  Head is atraumatic.  There is no jugular venous distention.  Lungs are clear.  Respiratory effort is nonlabored.  Cardiac exam reveals S1 and S2.  There are no clicks or significant murmurs.  The abdomen is soft there is no peripheral edema. Filed Vitals:   04/16/11 1012  BP: 113/66  Pulse: 49  Height: 5\' 6"  (1.676 m)  Weight: 161 lb (73.029 kg)  SpO2: 98%    EKG Is not done today.  ASSESSMENT & PLAN

## 2011-04-16 NOTE — Assessment & Plan Note (Signed)
Stable on his current medications.  No further changes.

## 2011-04-16 NOTE — Assessment & Plan Note (Signed)
Blood pressure control. No change in therapy. 

## 2011-04-16 NOTE — Patient Instructions (Signed)
Follow up as scheduled. Your physician recommends that you continue on your current medications as directed. Please refer to the Current Medication list given to you today. 

## 2011-05-22 ENCOUNTER — Other Ambulatory Visit: Payer: Self-pay | Admitting: Cardiology

## 2011-05-24 NOTE — Telephone Encounter (Signed)
Pt calling re refill request from Tuesday, needs today, pt out now

## 2011-05-25 ENCOUNTER — Other Ambulatory Visit: Payer: Self-pay | Admitting: *Deleted

## 2011-07-13 ENCOUNTER — Encounter: Payer: Self-pay | Admitting: Cardiology

## 2011-07-17 ENCOUNTER — Encounter: Payer: Self-pay | Admitting: Cardiology

## 2011-07-17 ENCOUNTER — Ambulatory Visit (INDEPENDENT_AMBULATORY_CARE_PROVIDER_SITE_OTHER): Payer: Medicare Other | Admitting: Cardiology

## 2011-07-17 DIAGNOSIS — I428 Other cardiomyopathies: Secondary | ICD-10-CM

## 2011-07-17 DIAGNOSIS — R609 Edema, unspecified: Secondary | ICD-10-CM

## 2011-07-17 DIAGNOSIS — I447 Left bundle-branch block, unspecified: Secondary | ICD-10-CM

## 2011-07-17 NOTE — Patient Instructions (Signed)
Your physician wants you to follow-up in: 6 months. You will receive a reminder letter in the mail one-two months in advance. If you don't receive a letter, please call our office to schedule the follow-up appointment. Your physician recommends that you continue on your current medications as directed. Please refer to the Current Medication list given to you today. Your physician recommends that you go to the [facility] for lab work: BMET. 

## 2011-07-17 NOTE — Assessment & Plan Note (Signed)
When I see him back next I will consider followup echo.  I very carefully considered whether I would titrate his medications any further.  I want to be sure that I do not cause him to have orthostatic hypotension.  He already has some vertigo at times.  He is 77 years of age and doing well.  I feel that not changing anything is the best approach.

## 2011-07-17 NOTE — Progress Notes (Signed)
HPI Patient is seen today for followup his cardiomyopathy.  I saw him last April 16, 2011.  At that time I did not change any of his medications.  He has some shortness of breath when climbing a hill playing golf.  He has not had syncope or presyncope.  He also has rare vertigo. No Known Allergies  Current Outpatient Prescriptions  Medication Sig Dispense Refill  . acetaminophen (TYLENOL) 325 MG tablet Take 650 mg by mouth every 6 (six) hours as needed.        [name] aspirin 81 MG tablet Take 81 mg by mouth daily.        . carvedilol (COREG) 6.25 MG tablet Take 6.25 mg by mouth 2 (two) times daily.        . furosemide (LASIX) 20 MG tablet Take 40 mg by mouth daily.       . ramipril (ALTACE) 5 MG capsule TAKE ONE CAPSULE BY MOUTH EVERY DAY  30 capsule  12  . ramipril (ALTACE) 5 MG tablet Take 5 mg by mouth daily.        . simvastatin (ZOCOR) 40 MG tablet Take 40 mg by mouth at bedtime.          History   Social History  . Marital Status: Married    Spouse Name: N/A    Number of Children: N/A  . Years of Education: N/A   Occupational History  . Retired     Printmaker   Social History Main Topics  . Smoking status: Former Smoker -- 1.5 packs/day for 10 years    Types: Cigarettes    Quit date: 10/29/1949  . Smokeless tobacco: Never Used  . Alcohol Use: No  . Drug Use: Not on file  . Sexually Active: Not on file   Other Topics Concern  . Not on file   Social History Narrative  . No narrative on file    No family history on file.  Past Medical History  Diagnosis Date  . Hypertension   . Hyperlipemia   . Cardiomyopathy     EF 15-20%... echo... September, 2011  . LBBB (left bundle branch block)   . Edema   . Carotid bruit     Right 11/2010-Doppler: Mild plaque formation (B) w/o hemodynamically significant ICA stenosis, Mild to Moderate (L) ECA Stenosis    Past Surgical History  Procedure Date  . Mastectomy   . Inguinal hernia repair     Left    ROS  Patient denies  fever, chills, headache, sweats, rash, change in vision,.  His hearing is stable but he does wear hearing.  He denies chest pain, cough, nausea vomiting, or urinary symptoms.  All other systems are reviewed and are negative   PHYSICAL EXAM He is actually quite stable today.  He is here with his wife.  He discussed it would probably be better to play 9 holes of golf instead of 18.  Head is atraumatic.  There is no jugular venous distention.  Lungs are clear.  Respiratory effort is nonlabored.  Cardiac exam reveals S1-S2.  No clicks or significant murmurs.  Abdomen soft there is no peripheral edema. There were no vitals filed for this visit.  EKG EKG today reveals old left bundle branch block.  There is sinus rhythm ASSESSMENT & PLAN

## 2011-07-17 NOTE — Assessment & Plan Note (Signed)
He does not have any significant edema at this time.  No change in therapy.

## 2011-07-17 NOTE — Assessment & Plan Note (Signed)
EKG today does show his old left bundle branch block.  Her sinus rhythm.  I will see him back in 6 months.

## 2011-07-20 ENCOUNTER — Other Ambulatory Visit: Payer: Self-pay | Admitting: Cardiology

## 2011-08-13 ENCOUNTER — Ambulatory Visit (HOSPITAL_COMMUNITY)
Admission: RE | Admit: 2011-08-13 | Discharge: 2011-08-13 | Disposition: A | Discharge status: Home / Self Care | Payer: Medicare Other | Source: Ambulatory Visit | Attending: Family Medicine | Admitting: Family Medicine

## 2011-08-13 ENCOUNTER — Other Ambulatory Visit (HOSPITAL_COMMUNITY): Payer: Self-pay | Admitting: Family Medicine

## 2011-08-13 DIAGNOSIS — R0609 Other forms of dyspnea: Secondary | ICD-10-CM | POA: Insufficient documentation

## 2011-08-13 DIAGNOSIS — R0989 Other specified symptoms and signs involving the circulatory and respiratory systems: Secondary | ICD-10-CM | POA: Insufficient documentation

## 2011-09-14 ENCOUNTER — Emergency Department (HOSPITAL_COMMUNITY): Payer: Medicare Other

## 2011-09-14 ENCOUNTER — Encounter (HOSPITAL_COMMUNITY): Payer: Self-pay

## 2011-09-14 ENCOUNTER — Inpatient Hospital Stay (HOSPITAL_COMMUNITY)
Admission: EM | Admit: 2011-09-14 | Discharge: 2011-09-16 | DRG: 312 | Disposition: A | Discharge status: Home / Self Care | Payer: Medicare Other | Attending: Family Medicine | Admitting: Family Medicine

## 2011-09-14 ENCOUNTER — Other Ambulatory Visit: Payer: Self-pay

## 2011-09-14 DIAGNOSIS — I447 Left bundle-branch block, unspecified: Secondary | ICD-10-CM | POA: Diagnosis present

## 2011-09-14 DIAGNOSIS — I509 Heart failure, unspecified: Secondary | ICD-10-CM | POA: Diagnosis present

## 2011-09-14 DIAGNOSIS — I5022 Chronic systolic (congestive) heart failure: Secondary | ICD-10-CM | POA: Diagnosis present

## 2011-09-14 DIAGNOSIS — T68XXXA Hypothermia, initial encounter: Secondary | ICD-10-CM

## 2011-09-14 DIAGNOSIS — R42 Dizziness and giddiness: Secondary | ICD-10-CM | POA: Diagnosis present

## 2011-09-14 DIAGNOSIS — R55 Syncope and collapse: Principal | ICD-10-CM | POA: Diagnosis present

## 2011-09-14 DIAGNOSIS — E785 Hyperlipidemia, unspecified: Secondary | ICD-10-CM | POA: Diagnosis present

## 2011-09-14 DIAGNOSIS — I10 Essential (primary) hypertension: Secondary | ICD-10-CM | POA: Diagnosis present

## 2011-09-14 LAB — DIFFERENTIAL
Basophils Absolute: 0 10*3/uL (ref 0.0–0.1)
Basophils Relative: 0 % (ref 0–1)
Eosinophils Absolute: 0.2 10*3/uL (ref 0.0–0.7)
Monocytes Relative: 4 % (ref 3–12)
Neutro Abs: 7.8 10*3/uL — ABNORMAL HIGH (ref 1.7–7.7)
Neutrophils Relative %: 82 % — ABNORMAL HIGH (ref 43–77)

## 2011-09-14 LAB — CBC
Hemoglobin: 15.6 g/dL (ref 13.0–17.0)
MCH: 31.3 pg (ref 26.0–34.0)
MCHC: 32.6 g/dL (ref 30.0–36.0)
Platelets: 212 10*3/uL (ref 150–400)
RDW: 14.2 % (ref 11.5–15.5)

## 2011-09-14 LAB — COMPREHENSIVE METABOLIC PANEL
AST: 29 U/L (ref 0–37)
Albumin: 4.4 g/dL (ref 3.5–5.2)
Alkaline Phosphatase: 134 U/L — ABNORMAL HIGH (ref 39–117)
BUN: 33 mg/dL — ABNORMAL HIGH (ref 6–23)
Chloride: 97 mEq/L (ref 96–112)
Potassium: 5.2 mEq/L — ABNORMAL HIGH (ref 3.5–5.1)
Total Protein: 7.9 g/dL (ref 6.0–8.3)

## 2011-09-14 LAB — POCT I-STAT TROPONIN I
Troponin i, poc: 0 ng/mL (ref 0.00–0.08)
Troponin i, poc: 0 ng/mL (ref 0.00–0.08)

## 2011-09-14 LAB — PRO B NATRIURETIC PEPTIDE: Pro B Natriuretic peptide (BNP): 4089 pg/mL — ABNORMAL HIGH (ref 0–450)

## 2011-09-14 MED ORDER — POLYETHYLENE GLYCOL 3350 17 G PO PACK
17.0000 g | PACK | Freq: Every day | ORAL | Status: DC
  Filled 2011-09-14: qty 1

## 2011-09-14 MED ORDER — ACETAMINOPHEN 650 MG RE SUPP: 650.0000 mg | Freq: Four times a day (QID) | RECTAL | Status: DC | PRN

## 2011-09-14 MED ORDER — ONDANSETRON HCL 4 MG PO TABS: 4.0000 mg | ORAL_TABLET | Freq: Four times a day (QID) | ORAL | Status: DC | PRN

## 2011-09-14 MED ORDER — ACETAMINOPHEN 325 MG PO TABS: 650.0000 mg | ORAL_TABLET | Freq: Four times a day (QID) | ORAL | Status: DC | PRN

## 2011-09-14 MED ORDER — ALUM & MAG HYDROXIDE-SIMETH 200-200-20 MG/5ML PO SUSP: 30.0000 mL | Freq: Four times a day (QID) | ORAL | Status: DC | PRN

## 2011-09-14 MED ORDER — HYDROCODONE-ACETAMINOPHEN 5-325 MG PO TABS: 1.0000 | ORAL_TABLET | ORAL | Status: DC | PRN

## 2011-09-14 MED ORDER — CARVEDILOL 3.125 MG PO TABS
6.2500 mg | ORAL_TABLET | Freq: Two times a day (BID) | ORAL | Status: DC
  Administered 2011-09-15 – 2011-09-16 (×2): 6.25 mg via ORAL
  Filled 2011-09-14 (×3): qty 2

## 2011-09-14 MED ORDER — ONDANSETRON HCL 4 MG/2ML IJ SOLN
4.0000 mg | Freq: Once | INTRAMUSCULAR | Status: AC
  Administered 2011-09-14: 4 mg via INTRAVENOUS
  Filled 2011-09-14: qty 2

## 2011-09-14 MED ORDER — RAMIPRIL 2.5 MG PO CAPS
5.0000 mg | ORAL_CAPSULE | Freq: Every day | ORAL | Status: DC
  Administered 2011-09-15: 5 mg via ORAL
  Filled 2011-09-14: qty 2

## 2011-09-14 MED ORDER — MECLIZINE HCL 12.5 MG PO TABS
25.0000 mg | ORAL_TABLET | Freq: Once | ORAL | Status: AC
  Administered 2011-09-14: 25 mg via ORAL
  Filled 2011-09-14: qty 2

## 2011-09-14 MED ORDER — ONDANSETRON HCL 4 MG/2ML IJ SOLN: 4.0000 mg | Freq: Four times a day (QID) | INTRAMUSCULAR | Status: DC | PRN

## 2011-09-14 MED ORDER — FUROSEMIDE 20 MG PO TABS
20.0000 mg | ORAL_TABLET | Freq: Two times a day (BID) | ORAL | Status: DC
  Administered 2011-09-15 – 2011-09-16 (×3): 20 mg via ORAL
  Filled 2011-09-14 (×4): qty 1

## 2011-09-14 MED ORDER — SODIUM CHLORIDE 0.9 % IV SOLN
INTRAVENOUS | Status: DC
  Administered 2011-09-15 (×2): via INTRAVENOUS

## 2011-09-14 MED ORDER — ENOXAPARIN SODIUM 40 MG/0.4ML ~~LOC~~ SOLN
40.0000 mg | Freq: Every day | SUBCUTANEOUS | Status: DC
  Filled 2011-09-14: qty 0.4

## 2011-09-14 MED ORDER — ASPIRIN 81 MG PO CHEW
81.0000 mg | CHEWABLE_TABLET | Freq: Every day | ORAL | Status: DC
  Administered 2011-09-15: 81 mg via ORAL
  Filled 2011-09-14: qty 1

## 2011-09-14 MED ORDER — SIMVASTATIN 20 MG PO TABS
40.0000 mg | ORAL_TABLET | Freq: Every day | ORAL | Status: DC
  Administered 2011-09-15: 40 mg via ORAL
  Filled 2011-09-14: qty 1

## 2011-09-14 NOTE — H&P (Signed)
Javier Richards MRN: [id number] DOB/AGE: 75-Nov-1922 75 y.o. Primary Care Physician:MCINNIS,ANGUS G, MD Admit date: 09/14/2011 Chief Complaint: Syncope HPI: This is an 77 year old who came to the emergency room after having had a syncopal episode. He said that he was emptying anxious from his fireplace when he finished doing that and became markedly dizzy and tired. He said that he stopped to rest for a minute and then lost consciousness and woke up he thinks about 10-15 minutes later. He did not lose control of his bladder or bowels. He said he eventually was able to get up but it took him a while because he was very weak. He has had some vertigo sensation. He said his head felt funny but he's been better since she's been in the emergency room. He has a history of congestive heart failure. He is not having any headache chest pain or change in his strength on either extremity that is not symmetrical he was apparently diaphoretic  Past Medical History  Diagnosis Date  . Hypertension   . Hyperlipemia   . Cardiomyopathy     EF 15-20%... echo... September, 2011  . LBBB (left bundle branch block)   . Edema   . Carotid bruit     Right 11/2010-Doppler: Mild plaque formation (B) w/o hemodynamically significant ICA stenosis, Mild to Moderate (L) ECA Stenosis   Past Surgical History  Procedure Date  . Inguinal hernia repair     Left        No family history on file.  Social History:  reports that he quit smoking about 61 years ago. His smoking use included Cigarettes. He has a 15 pack-year smoking history. He has never used smokeless tobacco. He reports that he does not drink alcohol. His drug history not on file.   Allergies: No Known Allergies  Medications Prior to Admission  Medication Dose Route Frequency Provider Last Rate Last Dose  . meclizine (ANTIVERT) tablet 25 mg  25 mg Oral Once Ward Givens, MD   25 mg at 09/14/11 1758  . ondansetron (ZOFRAN) injection 4 mg  4 mg Intravenous Once Ward Givens, MD   4 mg at 09/14/11 1757   Medications Prior to Admission  Medication Sig Dispense Refill  . aspirin 81 MG tablet Take 81 mg by mouth daily.        . carvedilol (COREG) 6.25 MG tablet TAKE ONE TABLET BY MOUTH TWICE DAILY  60 tablet  6  . furosemide (LASIX) 20 MG tablet Take 20 mg by mouth 2 (two) times daily.       . ramipril (ALTACE) 5 MG capsule        . simvastatin (ZOCOR) 40 MG tablet Take 40 mg by mouth at bedtime.        [name] acetaminophen (TYLENOL) 325 MG tablet Take 650 mg by mouth every 6 (six) hours as needed. For pain           GEX:BMWUX from the symptoms mentioned above,there are no other symptoms referable to all systems reviewed.  Physical Exam: Blood pressure 118/55, pulse 57, temperature 96.1 F (35.6 C), temperature source Rectal, resp. rate 20, height 5\' 6"  (1.676 m), weight 72.576 kg (160 lb), SpO2 97.00%. He is an elderly man who is awake and alert now. He says he feels much better. He looks comfortable. His HEENT examination shows that his pupils are reactive to light and accommodation. His nose and throat are clear. His mucous membranes are slightly dry. His neck is supple  without masses bruits or JVD. His chest is relatively clear. His heart is regular without gallop. His abdomen is soft without masses. L. sounds present and active. His extremities showed no edema. His central nervous system examination now is grossly intact he moves all 4 extremities he has normal strength.  Results for orders placed during the hospital encounter of 09/14/11 (from the past 48 hour(s))  CBC     Status: Normal   Collection Time   09/14/11  5:40 PM      Component Value Range Comment   WBC 9.5  4.0 - 10.5 (K/uL)    RBC 4.99  4.22 - 5.81 (MIL/uL)    Hemoglobin 15.6  13.0 - 17.0 (g/dL)    HCT 16.1  09.6 - 04.5 (%)    MCV 96.0  78.0 - 100.0 (fL)    MCH 31.3  26.0 - 34.0 (pg)    MCHC 32.6  30.0 - 36.0 (g/dL)    RDW 40.9  81.1 - 91.4 (%)    Platelets 212  150 - 400 (K/uL)     DIFFERENTIAL     Status: Abnormal   Collection Time   09/14/11  5:40 PM      Component Value Range Comment   Neutrophils Relative 82 (*) 43 - 77 (%)    Neutro Abs 7.8 (*) 1.7 - 7.7 (K/uL)    Lymphocytes Relative 11 (*) 12 - 46 (%)    Lymphs Abs 1.1  0.7 - 4.0 (K/uL)    Monocytes Relative 4  3 - 12 (%)    Monocytes Absolute 0.4  0.1 - 1.0 (K/uL)    Eosinophils Relative 2  0 - 5 (%)    Eosinophils Absolute 0.2  0.0 - 0.7 (K/uL)    Basophils Relative 0  0 - 1 (%)    Basophils Absolute 0.0  0.0 - 0.1 (K/uL)   COMPREHENSIVE METABOLIC PANEL     Status: Abnormal   Collection Time   09/14/11  5:40 PM      Component Value Range Comment   Sodium 136  135 - 145 (mEq/L)    Potassium 5.2 (*) 3.5 - 5.1 (mEq/L)    Chloride 97  96 - 112 (mEq/L)    CO2 32  19 - 32 (mEq/L)    Glucose, Bld 190 (*) 70 - 99 (mg/dL)    BUN 33 (*) 6 - 23 (mg/dL)    Creatinine, Ser 7.82  0.50 - 1.35 (mg/dL)    Calcium 95.6  8.4 - 10.5 (mg/dL)    Total Protein 7.9  6.0 - 8.3 (g/dL)    Albumin 4.4  3.5 - 5.2 (g/dL)    AST 29  0 - 37 (U/L)    ALT 27  0 - 53 (U/L)    Alkaline Phosphatase 134 (*) 39 - 117 (U/L)    Total Bilirubin 0.5  0.3 - 1.2 (mg/dL)    GFR calc non Af Amer 54 (*) >90 (mL/min)    GFR calc Af Amer 63 (*) >90 (mL/min)   PRO B NATRIURETIC PEPTIDE     Status: Abnormal   Collection Time   09/14/11  5:40 PM      Component Value Range Comment   BNP, POC 4089.0 (*) 0 - 450 (pg/mL)   POCT I-STAT TROPONIN I     Status: Normal   Collection Time   09/14/11  5:51 PM      Component Value Range Comment   Troponin i, poc 0.00  0.00 - 0.08 (  ng/mL)    Comment 3            POCT I-STAT TROPONIN I     Status: Normal   Collection Time   09/14/11  8:39 PM      Component Value Range Comment   Troponin i, poc 0.00  0.00 - 0.08 (ng/mL)    Comment 3             No results found for this or any previous visit (from the past 240 hour(s)).  Ct Abdomen Pelvis Wo Contrast  09/14/2011  *RADIOLOGY REPORT*  Clinical  Data: Nausea and vomiting.  Weakness and dizziness.  CT ABDOMEN AND PELVIS WITHOUT CONTRAST  Technique:  Multidetector CT imaging of the abdomen and pelvis was performed following the standard protocol without intravenous contrast.  Comparison: None.  Findings: Mild to moderate cardiomegaly noted with diffuse coronary artery calcification.  Noncontrast images of the abdominal parenchymal organs are normal in appearance.  Gallbladder is unremarkable.  No evidence of hydronephrosis.  No evidence of urolithiasis.  No soft tissue masses or lymphadenopathy identified within the abdomen or pelvis.  No evidence of inflammatory process or abnormal fluid collections.  Unopacified bowel is unremarkable in appearance.  IMPRESSION:  1.  No acute findings. 2.  Mild to moderate cardiomegaly and diffuse coronary artery calcification noted.  Original Report Authenticated By: Danae Orleans, M.D.   Ct Head Wo Contrast  09/14/2011  *RADIOLOGY REPORT*  Clinical Data: Syncope, dizziness, weakness  CT HEAD WITHOUT CONTRAST  Technique:  Contiguous axial images were obtained from the base of the skull through the vertex without contrast.  Comparison: None.  Findings: No acute hemorrhage, acute infarction, or mass lesion is seen.  Mild cortical volume loss is noted with proportional ventricular prominence.  No midline shift.  Mild motion artifact is noted at the skull base.  No skull fracture.  Orbits and paranasal sinuses are unremarkable allowing for motion.  IMPRESSION: No acute intracranial finding.  Original Report Authenticated By: Harrel Lemon, M.D.   Dg Chest Portable 1 View  09/14/2011  *RADIOLOGY REPORT*  Clinical Data: Shortness of breath, nausea, syncope  PORTABLE CHEST - 1 VIEW  Comparison: 08/13/2011  Findings: Chronic interstitial markings.  Stable nodular opacity overlying the right lung base, possibly reflecting a nipple shadow. No pleural effusion or pneumothorax.  Stable cardiomegaly.  IMPRESSION: No evidence  of acute cardiopulmonary disease.  Stable cardiomegaly with chronic interstitial markings.  Original Report Authenticated By: Charline Bills, M.D.   Impression: He has had a syncopal episode. Thus far he has no evidence of myocardial infarction stroke etc. and may simply have had a severe vertiginous episode. Active Problems:  * No active hospital problems. *      Plan: He'll have cardiac markers run serially he'll be on a low-volume IV fluids because of his history of congestive heart failure he'll have orthostatic vital signs done.      Priyanka Causey L 09/14/2011, 9:52 PM

## 2011-09-14 NOTE — ED Provider Notes (Signed)
Scribed for Ward Givens, MD, the patient was seen in room [facility]02/[facility] . This chart was scribed by Ellie Lunch.   CSN: [id number] Arrival date & time: 09/14/2011  5:11 PM   First MD Initiated Contact with Patient 09/14/11 1704      Chief Complaint  Patient presents with  . Dizziness  . Loss of Consciousness    (Consider location/radiation/quality/duration/timing/severity/associated sxs/prior treatment) The history is provided by the patient and the spouse. No language interpreter was used.   Pt seen 5:31 PM Javier Richards is a 77 y.o. male who presents to the Emergency Department complaining of 1 syncopal episode ~ 1 hour ago. Pt was outside emptying ashes from his fireplace when he suddenly began to feel "dizzy" and tired and lost consciousness. Next thing Pt remembers is waking up on the ground by his porch. Pt believes his syncopal episode lasted ~ 15 minutes. On waking up, Pt felt extremely nauseated, diaphoretic, mildly SOB and "woozy" like he might lose consciousness again. Pt was able to slowly renter the house on his own and it took him 30 minutes to get back into the house by holding onto things so he wouldn't pass out again. He reports he had 1 episode of emesis after reentering his house ~30 minutes later. Currently sx are unchanged, Pt reports he still feels "woozy" like he might pass out and nauseated. Pt also keeps his eyes closed b/c opening them worsens his nausea. Pt denies HA, CP, or experiencing a spinning sensation currently or during his syncopal episode. Pt also denies any new leg swelling or abdominal pain. Pt has history of vertigo, but says this episode is different. Typically his vertigo is a spinning sensation with a sudden change in position. Pt denies the spinning sensation. Patient relates before and now his head does not feel right he cannot describe it. He denies pain or spinning. He states after he passed out he couldn't walk because he said he felt weak and his head  felt tight. He relates he relates he has some mild shortness of breath. He denies swelling of his legs. Both he and wife report diaphoresis. Patient's never had this happen before.  PCP Dr. Hedy Jacob. Seen at PCP 4 days ago. Follow up with Dr. [name] 11/26.  Past Medical History  Diagnosis Date  . Hypertension   . Hyperlipemia   . Cardiomyopathy     EF 15-20%... echo... September, 2011  . LBBB (left bundle branch block)   . Edema   . Carotid bruit     Right 11/2010-Doppler: Mild plaque formation (B) w/o hemodynamically significant ICA stenosis, Mild to Moderate (L) ECA Stenosis    Past Surgical History  Procedure Date  . Inguinal hernia repair     Left    No family history on file.  History  Substance Use Topics  . Smoking status: Former Smoker -- 1.5 packs/day for 10 years    Types: Cigarettes    Quit date: 10/29/1949  . Smokeless tobacco: Never Used  . Alcohol Use: No  Pt accompanied to ED by wife   Review of Systems  All other systems reviewed and are negative.   Allergies  Review of patient's allergies indicates no known allergies.  Home Medications   Current Outpatient Rx  Name Route Sig Dispense Refill  . ASPIRIN 81 MG PO TABS Oral Take 81 mg by mouth daily.      [name] CARVEDILOL 6.25 MG PO TABS  TAKE ONE TABLET BY MOUTH TWICE DAILY  60 tablet 6  . FUROSEMIDE 20 MG PO TABS Oral Take 20 mg by mouth 2 (two) times daily.     [name] RAMIPRIL 5 MG PO CAPS       . SIMVASTATIN 40 MG PO TABS Oral Take 40 mg by mouth at bedtime.      . ACETAMINOPHEN 325 MG PO TABS Oral Take 650 mg by mouth every 6 (six) hours as needed. For pain      BP 145/77  Pulse 66  Resp 20  Ht 5\' 6"  (1.676 m)  Wt 160 lb (72.576 kg)  BMI 25.82 kg/m2  SpO2 96%  Vital signs normal except for hyponatremia  Physical Exam  Vitals reviewed. Constitutional: He appears well-developed and well-nourished.       Elderly patient who is lying quietly on a stretcher keeping his eyes closed  HENT:  Head:  Normocephalic and atraumatic.  Mouth/Throat: Uvula is midline, oropharynx is clear and moist and mucous membranes are normal.       No trauma to head  Eyes: Conjunctivae and EOM are normal. Pupils are equal, round, and reactive to light.       No nystagmus seen  Neck: Normal range of motion. Neck supple.  Cardiovascular: Normal rate, regular rhythm and normal heart sounds.   Pulmonary/Chest: Effort normal and breath sounds normal.  Abdominal: Soft. Bowel sounds are normal. He exhibits no mass. There is tenderness. There is no rebound and no guarding.       Patient has mild discomfort in his upper abdomen to palpation.  Musculoskeletal:       Pt has no acute abnormality.    Skin: Skin is warm and dry. No rash noted. There is pallor.  Psychiatric: He has a normal mood and affect. His behavior is normal. Thought content normal.    ED Course  Procedures (including critical care time)  OTHER DATA REVIEWED: Nursing notes, vital signs, and past medical records reviewed.   DIAGNOSTIC STUDIES: Oxygen Saturation is 96% on room air, normal by my interpretation.     Date: 09/14/2011  Rate: 63  Rhythm: normal sinus rhythm  QRS Axis: left  Intervals: normal  ST/T Wave abnormalities: normal  Conduction Disutrbances:left bundle branch block  Narrative Interpretation:   Old EKG Reviewed: none available (Problem list describes old LBBB)  Results for orders placed during the hospital encounter of 09/14/11  CBC      Component Value Range   WBC 9.5  4.0 - 10.5 (K/uL)   RBC 4.99  4.22 - 5.81 (MIL/uL)   Hemoglobin 15.6  13.0 - 17.0 (g/dL)   HCT 16.1  09.6 - 04.5 (%)   MCV 96.0  78.0 - 100.0 (fL)   MCH 31.3  26.0 - 34.0 (pg)   MCHC 32.6  30.0 - 36.0 (g/dL)   RDW 40.9  81.1 - 91.4 (%)   Platelets 212  150 - 400 (K/uL)  DIFFERENTIAL      Component Value Range   Neutrophils Relative 82 (*) 43 - 77 (%)   Neutro Abs 7.8 (*) 1.7 - 7.7 (K/uL)   Lymphocytes Relative 11 (*) 12 - 46 (%)   Lymphs  Abs 1.1  0.7 - 4.0 (K/uL)   Monocytes Relative 4  3 - 12 (%)   Monocytes Absolute 0.4  0.1 - 1.0 (K/uL)   Eosinophils Relative 2  0 - 5 (%)   Eosinophils Absolute 0.2  0.0 - 0.7 (K/uL)   Basophils Relative 0  0 - 1 (%)  Basophils Absolute 0.0  0.0 - 0.1 (K/uL)  COMPREHENSIVE METABOLIC PANEL      Component Value Range   Sodium 136  135 - 145 (mEq/L)   Potassium 5.2 (*) 3.5 - 5.1 (mEq/L)   Chloride 97  96 - 112 (mEq/L)   CO2 32  19 - 32 (mEq/L)   Glucose, Bld 190 (*) 70 - 99 (mg/dL)   BUN 33 (*) 6 - 23 (mg/dL)   Creatinine, Ser 1.61  0.50 - 1.35 (mg/dL)   Calcium 09.6  8.4 - 10.5 (mg/dL)   Total Protein 7.9  6.0 - 8.3 (g/dL)   Albumin 4.4  3.5 - 5.2 (g/dL)   AST 29  0 - 37 (U/L)   ALT 27  0 - 53 (U/L)   Alkaline Phosphatase 134 (*) 39 - 117 (U/L)   Total Bilirubin 0.5  0.3 - 1.2 (mg/dL)   GFR calc non Af Amer 54 (*) >90 (mL/min)   GFR calc Af Amer 63 (*) >90 (mL/min)  PRO B NATRIURETIC PEPTIDE      Component Value Range   BNP, POC 4089.0 (*) 0 - 450 (pg/mL)  POCT I-STAT TROPONIN I      Component Value Range   Troponin i, poc 0.00  0.00 - 0.08 (ng/mL)   Comment 3           POCT I-STAT TROPONIN I      Component Value Range   Troponin i, poc 0.00  0.00 - 0.08 (ng/mL)   Comment 3            Ct Abdomen Pelvis Wo Contrast  09/14/2011  *RADIOLOGY REPORT*  Clinical Data: Nausea and vomiting.  Weakness and dizziness.  CT ABDOMEN AND PELVIS WITHOUT CONTRAST  Technique:  Multidetector CT imaging of the abdomen and pelvis was performed following the standard protocol without intravenous contrast.  Comparison: None.  Findings: Mild to moderate cardiomegaly noted with diffuse coronary artery calcification.  Noncontrast images of the abdominal parenchymal organs are normal in appearance.  Gallbladder is unremarkable.  No evidence of hydronephrosis.  No evidence of urolithiasis.  No soft tissue masses or lymphadenopathy identified within the abdomen or pelvis.  No evidence of inflammatory  process or abnormal fluid collections.  Unopacified bowel is unremarkable in appearance.  IMPRESSION:  1.  No acute findings. 2.  Mild to moderate cardiomegaly and diffuse coronary artery calcification noted.  Original Report Authenticated By: Danae Orleans, M.D.   Ct Head Wo Contrast  09/14/2011  *RADIOLOGY REPORT*  Clinical Data: Syncope, dizziness, weakness  CT HEAD WITHOUT CONTRAST  Technique:  Contiguous axial images were obtained from the base of the skull through the vertex without contrast.  Comparison: None.  Findings: No acute hemorrhage, acute infarction, or mass lesion is seen.  Mild cortical volume loss is noted with proportional ventricular prominence.  No midline shift.  Mild motion artifact is noted at the skull base.  No skull fracture.  Orbits and paranasal sinuses are unremarkable allowing for motion.  IMPRESSION: No acute intracranial finding.  Original Report Authenticated By: Harrel Lemon, M.D.   Dg Chest Portable 1 View  09/14/2011  *RADIOLOGY REPORT*  Clinical Data: Shortness of breath, nausea, syncope  PORTABLE CHEST - 1 VIEW  Comparison: 08/13/2011  Findings: Chronic interstitial markings.  Stable nodular opacity overlying the right lung base, possibly reflecting a nipple shadow. No pleural effusion or pneumothorax.  Stable cardiomegaly.  IMPRESSION: No evidence of acute cardiopulmonary disease.  Stable cardiomegaly with chronic interstitial markings.  Original Report Authenticated By: Charline Bills, M.D.     ED MEDICATIONS Medications  ondansetron [facility]) injection 4 mg (4 mg Intravenous Given 09/14/11 1757)  meclizine (ANTIVERT) tablet 25 mg (25 mg Oral Given 09/14/11 1758)   7:04 PM Pt recheck. Pt reports some improvement after receiving medications. States his nausea is gone he still feels funny in his head. 8:17 PM Pt recheck. Pt reports feeling improved. He should seems much clearer now in his speech and able to communicate much better than when I first saw  him. Discussed possible overnight to observe pt.   20:59  Dr. Juanetta Gosling is going to come admit patient.   1. Syncope   2. Hypothermia due to cold exposure   3. Dizziness     Plan admission  MDM    I personally performed the services described in this documentation, which was scribed in my presence. The recorded information has been reviewed and considered. Devoria Albe, MD, Armando Gang        Ward Givens, MD 09/14/11 [phone]

## 2011-09-14 NOTE — ED Notes (Signed)
Per pt he has felt dizzy all day. Pt also states he has been vomiting.

## 2011-09-14 NOTE — ED Notes (Signed)
Attempted report, RN not available, will call back.

## 2011-09-15 LAB — CBC
HCT: 39.8 % (ref 39.0–52.0)
Hemoglobin: 13.1 g/dL (ref 13.0–17.0)
MCH: 31.2 pg (ref 26.0–34.0)
MCV: 94.8 fL (ref 78.0–100.0)
Platelets: 168 10*3/uL (ref 150–400)
RBC: 4.2 MIL/uL — ABNORMAL LOW (ref 4.22–5.81)
WBC: 9.1 10*3/uL (ref 4.0–10.5)

## 2011-09-15 LAB — CARDIAC PANEL(CRET KIN+CKTOT+MB+TROPI)
CK, MB: 4.9 ng/mL — ABNORMAL HIGH (ref 0.3–4.0)
Relative Index: 4.9 — ABNORMAL HIGH (ref 0.0–2.5)
Total CK: 100 U/L (ref 7–232)
Troponin I: <0.3 ng/mL (ref <0.30)

## 2011-09-15 LAB — BASIC METABOLIC PANEL
CO2: 32 mEq/L (ref 19–32)
Calcium: 9.9 mg/dL (ref 8.4–10.5)
Chloride: 99 mEq/L (ref 96–112)
Glucose, Bld: 143 mg/dL — ABNORMAL HIGH (ref 70–99)
Sodium: 135 mEq/L (ref 135–145)

## 2011-09-15 NOTE — Progress Notes (Signed)
Javier Richards BP and heart rate were reported via phone to Dr Juanetta Gosling. Dr Juanetta Gosling is aware that Javier Richards and Lasix was held.  No further orders received.  BP 96/52 HR58

## 2011-09-15 NOTE — Progress Notes (Signed)
Javier Richards, Javier Richards                   ACCOUNT NO.:  [id number]  MEDICAL RECORD NO.:  [id number]  LOCATION:  A321                          FACILITY:  [facility]  PHYSICIAN:  Reeya Bound G. Renard Matter, MD   DATE OF BIRTH:  16-Nov-1920  DATE OF PROCEDURE: DATE OF DISCHARGE:                                PROGRESS NOTE   This patient had sudden syncopal episode prior to admission.  He was brought in through the emergency department.  He does have a history of congestive heart failure and cardiomyopathy.  He was relatively asymptomatic this morning.  OBJECTIVE:  VITAL SIGNS:  Blood pressure 112/59, respirations 19, pulse 58, temp 97.3. HEART:  Regular rhythm. LUNGS: Clear to P and A. ABDOMEN: No palpable organs or masses. NEUROLOGIC: No focal deficit.  PERTINENT LABORATORY DATA:  His current cardiac panel, CK 500, CK-MB 4.9, troponin less than 0.30, relative index 4.9.  ASSESSMENT:  The patient admitted with syncopal episode, but does not have evidence of myocardial infarction, stroke, etc.  He does have a history of congestive heart failure and cardiomyopathy in the past.  PLAN:  To monitor his cardiac markers.  Continue IV fluids.  Continue to monitor blood pressures.     Javier Richards G. Renard Matter, MD     AGM/MEDQ  D:  09/15/2011  T:  09/15/2011  Job:  [id number]

## 2011-09-16 NOTE — Discharge Summary (Signed)
Javier Richards, Javier Richards                   ACCOUNT NO.:  [id number]  MEDICAL RECORD NO.:  [id number]  LOCATION:  A321                          FACILITY:  [facility]  PHYSICIAN:  Westlynn Fifer G. Renard Matter, MD   DATE OF BIRTH:  [birth year]  DATE OF ADMISSION:  09/14/2011 DATE OF DISCHARGE:  11/18/2012LH                              DISCHARGE SUMMARY   This 77 year old white male was admitted September 14, 2011, discharged September 16, 2011, 2 days hospitalization.  DIAGNOSES:  Syncope, vertiginous episode, history of cardiomyopathy with ejection fraction of 15-20%, left bundle-branch block, hyperlipidemia, hypertension.  CONDITION:  Stable and improved at the time of his discharge.  A 77 year old came to emergency room having had a recent syncopal episode.  He was working outside and became extremely dizzy and tired, stopped to rest for a minute and lost consciousness. Some 10-15 minutes later, he awakened, did not lose control of bladder or bowels, had some vertigo with this episode.  He does have a history of congestive heart failure and cardiomyopathy. He was subsequently seen in emergency room and subsequently was admitted.  PHYSICAL EXAMINATION:  VITAL SIGNS:  Blood pressure on admission 118/55, pulse 57, temp 96.1. HEENT:  Eyes:  PERRLA.  TMs negative.  Oropharynx benign. NECK:  Supple.  No JVD or thyroid abnormalities. HEART:  Regular rhythm. ABDOMEN:  No palpable organs or masses. EXTREMITIES:  Showed no edema. CENTRAL NERVOUS SYSTEM:  Exam is essentially within normal limits.  LABORATORY DATA:  Admission CBC:  WBC 9500 with hemoglobin 15.6, hematocrit 47.9.  Chemistries:  Sodium 136, potassium 5.2, chloride 97, CO2 32, BUN 33, creatinine 1.16, calcium 10.5, glucose 190, alkaline phosphatase 134, albumin 4.4, AST 29, ALT 27, total protein 7.9. Subsequent chemistries within fairly normal range.  CPK on admission 4.9.  Total CPK 100.  Troponin less than 0.30.  Subsequent CPK 3.9, total CK 83,  troponin less than 30.  BNP 4089.  Chest x-ray on admission showed stable cardiomegaly with chronic interstitial changes.  CT of head, no acute intracranial findings.  HOSPITAL COURSE:  The patient at the time of his admission was placed on IV sodium chloride 50 mL/hour.  He was continued on his usual medications. 1. Aspirin 81 mg daily. 2. Carvedilol 6.25 mg b.i.d. 3. Enoxaparin 40 mg daily. 4. Furosemide 20 mg b.i.d. 5. Polyethylene glycol 17 g daily. 6. Ramipril 5 mg daily. 7. Simvastatin 40 mg at bedtime.  The patient's vital signs were monitored and remained stable.  He had no further symptoms of syncope.  His blood pressure was slightly low on several occasions, 96/52 and 97/56.  It was felt that he could be discharged and followed as an outpatient and return to his cardiologist.  He was discharged on following medications: 1. Aspirin 81 mg daily. 2. Coreg 6.25 mg b.i.d. 3. Lasix 20 mg daily. 4. MiraLAX daily. 5. Altace 5 mg daily. 6. Zocor 40 mg daily.  The patient was stable and improved at time of his discharge and asked to return in the office for followup.     Deacon Gadbois G. Renard Matter, MD     AGM/MEDQ  D:  09/16/2011  T:  09/16/2011  Job:  [id number]

## 2011-09-24 ENCOUNTER — Encounter: Payer: Self-pay | Admitting: Cardiology

## 2011-09-24 ENCOUNTER — Ambulatory Visit: Payer: Medicare Other | Admitting: Cardiology

## 2011-09-24 ENCOUNTER — Ambulatory Visit (INDEPENDENT_AMBULATORY_CARE_PROVIDER_SITE_OTHER): Payer: Medicare Other | Admitting: Cardiology

## 2011-09-24 DIAGNOSIS — I428 Other cardiomyopathies: Secondary | ICD-10-CM

## 2011-09-24 DIAGNOSIS — I509 Heart failure, unspecified: Secondary | ICD-10-CM

## 2011-09-24 DIAGNOSIS — R55 Syncope and collapse: Secondary | ICD-10-CM

## 2011-09-24 NOTE — Assessment & Plan Note (Signed)
His volume status is under good control. No change in therapy. 

## 2011-09-24 NOTE — Patient Instructions (Signed)
   21 day Cardionet monitor  Echo  If the results of your test are normal or stable, you will receive a letter.  If they are abnormal, the nurse will contact you by phone. No driving till follow up back in office with MD Follow up in  1 month

## 2011-09-24 NOTE — Progress Notes (Signed)
HPI   This delightful 77 year old gentleman is seen for followup of his cardiomyopathy.  He is also seen post hospitalization.  On September 14, 2011, he had a syncopal episode.  He was hospitalized in [place] with no obvious findings and he was discharged home.  Several weeks before his diuretic dose had been adjusted.  However he didn't back on his usual dose of 20 mg of Lasix daily.  There was no marked electrolyte abnormality noted in the hospital.  There were no marked arrhythmias documented.  We know that he has severe left ventricular dysfunction.  His last echo was in 2011.  He is quite stable today.  In fact his wife asked if we could consider stem cell therapy for his left ventricular dysfunction. It was noted in the hospital and his systolic pressure was in the range of 95.  He was also noted that he may have had some vertigo around the time of the episode.  He tells me today that he had been fairly active at morning and had walked up a small hill before sitting down and feeling poorly.  He did vomit afterwards.  As part of the evaluation today I have reviewed completely the records from his hospitalization. No Known Allergies  Current Outpatient Prescriptions  Medication Sig Dispense Refill  . aspirin 81 MG tablet Take 81 mg by mouth daily.        . carvedilol (COREG) 6.25 MG tablet TAKE ONE TABLET BY MOUTH TWICE DAILY  60 tablet  6  . furosemide (LASIX) 20 MG tablet Take 20 mg by mouth 2 (two) times daily.       . potassium chloride (KLOR-CON) 20 MEQ packet Take 20 mEq by mouth daily.        . ramipril (ALTACE) 5 MG capsule Take 5 mg by mouth daily.       . simvastatin (ZOCOR) 40 MG tablet Take 40 mg by mouth at bedtime.          History   Social History  . Marital Status: Married    Spouse Name: N/A    Number of Children: N/A  . Years of Education: N/A   Occupational History  . Retired     Printmaker   Social History Main Topics  . Smoking status: Former Smoker -- 1.5  packs/day for 10 years    Types: Cigarettes    Quit date: 10/29/1949  . Smokeless tobacco: Never Used  . Alcohol Use: No  . Drug Use: Not on file  . Sexually Active: Not on file   Other Topics Concern  . Not on file   Social History Narrative  . No narrative on file    No family history on file.  Past Medical History  Diagnosis Date  . Hypertension   . Hyperlipemia   . Cardiomyopathy     EF 15-20%... echo... September, 2011  . LBBB (left bundle branch block)   . Edema   . Carotid bruit     Right 11/2010-Doppler: Mild plaque formation (B) w/o hemodynamically significant ICA stenosis, Mild to Moderate (L) ECA Stenosis  . Syncope     Nov.16, 2012, hospitalized [facility], etiology unclear, possibly vertigo component  . CHF (congestive heart failure)     Chronic systolic    Past Surgical History  Procedure Date  . Inguinal hernia repair     Left    ROS    Patient denies fever, chills, headache, sweats, rash, change in vision, change in hearing, chest  pain, cough, nausea vomiting, urinary symptoms.  All other systems are reviewed and are negative.  PHYSICAL EXAM   Patient is oriented to person time and place.  Affect is normal.  He is here with his wife.  Head is atraumatic.  There is no xanthelasma.  There is no jugular venous stent in.  Lungs are clear respiratory effort is not labored.  Cardiac exam reveals S1 and S2.  There is no clicks or significant murmurs.  The abdomen is soft.  There is no peripheral edema.  There are no musculoskeletal deformities.  There were no skin rashes.  Filed Vitals:   09/24/11 1513  BP: 112/70  Pulse: 59  Height: 5\' 6"  (1.676 m)  Weight: 155 lb (70.308 kg)    ASSESSMENT & PLAN

## 2011-09-24 NOTE — Assessment & Plan Note (Signed)
The etiology of the patient's syncopal episode remains unclear.  With his cardiomyopathy it is certainly concerning.  He is 77 years old.  However he is quite viable.  I am concerned about the possibility of a malignant arrhythmia.  We will proceed to repeat his echo to reassess his LV function as it has not been studied in greater than one year.  In addition I will have him wear an event recorder.  If he has significant arrhythmias I will consider the next step with the electrophysiologist.  In the meantime he will not be allowed to drive.  I've chosen not to proceed with an ischemic workup at this time.

## 2011-09-24 NOTE — Assessment & Plan Note (Signed)
The patient appears to be on appropriate medications.  No change at this point.

## 2011-09-28 DIAGNOSIS — R55 Syncope and collapse: Secondary | ICD-10-CM

## 2011-10-03 ENCOUNTER — Other Ambulatory Visit: Payer: Federal, State, Local not specified - PPO | Admitting: *Deleted

## 2011-10-04 ENCOUNTER — Other Ambulatory Visit (INDEPENDENT_AMBULATORY_CARE_PROVIDER_SITE_OTHER): Payer: Medicare Other | Admitting: *Deleted

## 2011-10-04 ENCOUNTER — Other Ambulatory Visit: Payer: Federal, State, Local not specified - PPO | Admitting: *Deleted

## 2011-10-04 DIAGNOSIS — R55 Syncope and collapse: Secondary | ICD-10-CM

## 2011-10-04 DIAGNOSIS — I428 Other cardiomyopathies: Secondary | ICD-10-CM

## 2011-10-08 ENCOUNTER — Ambulatory Visit: Payer: Medicare Other | Admitting: Cardiology

## 2011-10-08 ENCOUNTER — Encounter: Payer: Self-pay | Admitting: Cardiology

## 2011-10-08 NOTE — Progress Notes (Signed)
Echo in December, 2012 reveals ejection fraction slightly better at 20-25%. The patient is scheduled to be wearing an event recorder and to followup. When he is seen back in the office this data will have to be evaluated to decide if he needs to see electrophysiology.

## 2011-10-22 ENCOUNTER — Telehealth: Payer: Self-pay | Admitting: Cardiology

## 2011-10-22 NOTE — Telephone Encounter (Signed)
Patient informed and verbalized understanding of plan. 

## 2011-10-22 NOTE — Telephone Encounter (Signed)
Called patient to reschedule appointment from 12/26 to 01/09; done.   Patient requested:  Would like to know if he can drive now?  Patient reports he is feeling fine.

## 2011-10-24 ENCOUNTER — Ambulatory Visit: Payer: Federal, State, Local not specified - PPO | Admitting: Cardiology

## 2011-10-29 ENCOUNTER — Other Ambulatory Visit: Payer: Self-pay

## 2011-10-29 ENCOUNTER — Emergency Department (HOSPITAL_COMMUNITY): Payer: Medicare Other

## 2011-10-29 ENCOUNTER — Observation Stay (HOSPITAL_COMMUNITY)
Admission: EM | Admit: 2011-10-29 | Discharge: 2011-10-30 | Disposition: A | Discharge status: Home / Self Care | Payer: Medicare Other | Attending: Family Medicine | Admitting: Family Medicine

## 2011-10-29 ENCOUNTER — Encounter (HOSPITAL_COMMUNITY): Payer: Self-pay | Admitting: *Deleted

## 2011-10-29 DIAGNOSIS — R55 Syncope and collapse: Principal | ICD-10-CM | POA: Insufficient documentation

## 2011-10-29 DIAGNOSIS — I10 Essential (primary) hypertension: Secondary | ICD-10-CM | POA: Insufficient documentation

## 2011-10-29 DIAGNOSIS — I2589 Other forms of chronic ischemic heart disease: Secondary | ICD-10-CM | POA: Insufficient documentation

## 2011-10-29 DIAGNOSIS — Z79899 Other long term (current) drug therapy: Secondary | ICD-10-CM | POA: Insufficient documentation

## 2011-10-29 DIAGNOSIS — I447 Left bundle-branch block, unspecified: Secondary | ICD-10-CM | POA: Insufficient documentation

## 2011-10-29 DIAGNOSIS — E785 Hyperlipidemia, unspecified: Secondary | ICD-10-CM | POA: Insufficient documentation

## 2011-10-29 LAB — CBC
Platelets: 188 10*3/uL (ref 150–400)
RDW: 14.8 % (ref 11.5–15.5)
WBC: 9.5 10*3/uL (ref 4.0–10.5)

## 2011-10-29 LAB — URINALYSIS, ROUTINE W REFLEX MICROSCOPIC
Ketones, ur: NEGATIVE mg/dL
Leukocytes, UA: NEGATIVE
Nitrite: NEGATIVE
pH: 5 (ref 5.0–8.0)

## 2011-10-29 LAB — BASIC METABOLIC PANEL
Chloride: 98 mEq/L (ref 96–112)
Creatinine, Ser: 1.29 mg/dL (ref 0.50–1.35)
GFR calc Af Amer: 55 mL/min — ABNORMAL LOW (ref >90)
Potassium: 5.2 mEq/L — ABNORMAL HIGH (ref 3.5–5.1)

## 2011-10-29 LAB — TROPONIN I: Troponin I: <0.3 ng/mL (ref <0.30)

## 2011-10-29 NOTE — ED Notes (Signed)
Upon patient arrival pt placed on cardiac monitor - [place] at 2L. edp to bedside

## 2011-10-29 NOTE — ED Notes (Signed)
Patient arrived via [facility]. Per patient, states that he walked outside carrying wood. The next thing he remembers, is waking up on the floor. Arrived with LBB. cbg per ems 125. #20 to left hand.

## 2011-10-29 NOTE — Progress Notes (Signed)
Attempted to call ed back for report was on hold for awhile and then disconnected.  Recalling ED department.  No orders on the system or in navigator for this patient at this time.

## 2011-10-29 NOTE — H&P (Signed)
Javier Richards, ISENHOWER                   ACCOUNT NO.:  [id number]  MEDICAL RECORD NO.:  [id number]  LOCATION:  APA12                         FACILITY:  [facility]  PHYSICIAN:  Jaxsyn Azam G. Renard Matter, MD   DATE OF BIRTH:  08/31/1921  DATE OF ADMISSION:  10/29/2011 DATE OF DISCHARGE:  LH                             HISTORY & PHYSICAL   This 77 year old male was working in his yard, carrying firewood into house, had an episode of syncope, fell to the ground, and was unconscious for a very short period of time.  This was followed by an episode of nausea.  He apparently had episode of this sort previously, had been followed by Cardiology in [place], Dr. [name].  He apparently wore an event recorder for a period of time.  We do not have the results of this.  He does have known left bundle-branch block and cardiomyopathy with ejection fraction of 20-25% as a result of recent echocardiogram done in December 2012.  Evaluation in the emergency department essentially within normal limits, but it was felt he should be observed overnight at least on telemetry.  SOCIAL HISTORY:  The patient was a former cigarette smoker, does not use alcohol.  PAST MEDICAL HISTORY:  Hypertension; hyperlipidemia; cardiomyopathy; 15- 20% ejection fraction; left bundle-branch block; episodes of syncope; congestive heart failure, chronic.  SURGICAL HISTORY:  Inguinal hernia repair.  No family history on file.  REVIEW OF SYSTEMS:  HEENT:  Negative.  CARDIOPULMONARY:  No chest pain or dyspnea, episode of syncope.  GI:  No bowel irregularity or bleeding. GU:  No dysuria or hematuria.  ALLERGIES:  No known allergies.  MEDICATION LIST: 1. Aspirin 81 mg daily. 2. Carvedilol 6.25 mg daily. 3. Furosemide 20 mg b.i.d. 4. Potassium chloride 20 mEq daily. 5. Ramipril 5 mg daily. 6. Simvastatin 40 mg daily.  PHYSICAL EXAMINATION:  VITAL SIGNS:  Alert male with blood pressure 125/62, pulse 93, respirations 18. HEENT:  Eyes PERRLA.   TMs negative.  Oropharynx benign. NECK:  Supple.  No JVD or thyroid abnormalities. HEART:  Regular rhythm.  No murmurs.  No cardiomegaly. LUNGS:  Clear to P and A. ABDOMEN:  No palpable organs or masses.  No organomegaly. EXTREMITIES:  Free of edema. NEUROLOGIC:  Cranial nerves intact.  No motor or sensory disturbance. Reflexes are equal and normal.  ASSESSMENT:  The patient was admitted with syncope of undetermined etiology.  He does have history of hypertension, hyperlipidemia, cardiomyopathy with ejection fraction of 15-20%, left bundle-branch block.  Previous history of syncope and CHF.  PLAN:  To monitor the patient overnight at least on telemetry.  The patient is admitted to observation and telemetry bed.     [name] G. Renard Matter, MD     AGM/MEDQ  D:  10/29/2011  T:  10/29/2011  Job:  [id number]

## 2011-10-29 NOTE — ED Provider Notes (Signed)
History     CSN: [id number]  Arrival date & time 10/29/11  1743   First MD Initiated Contact with Patient 10/29/11 1759      Chief Complaint  Patient presents with  . Loss of Consciousness    (Consider location/radiation/quality/duration/timing/severity/associated sxs/prior treatment) HPI Comments: 77 year old male with a history of syncope, left bundle branch block, cardiomyopathy with an ejection fraction of 20-25% based on an echocardiogram from December 2012. He presents after a recurrent syncopal episode.  According to the patient and the medical record he had had a recent syncopal episode which resulted in a hospital admission, syncopal workup revealing no significant abnormalities. He was seen in followup by cardiology and was scheduled for an echocardiogram and to wear an event recorder with followup with cardiology. This followup has been postponed and he has not yet received the results of these tests. Today while he was working in his yard he was carrying some firewood into the house, had no prodromal symptoms in the next day and he realized he was on the ground after having passed out. He denies chest pain, palpitations, vertigo, dizziness, headache, focal weakness. This was acute in onset, resolve spontaneously and had some resultant nausea after the event. He does admit to having a recent episode while walking in the store where he developed left-sided weakness and fell to the ground. This lasted several minutes and completely resolved.  CBG of 125, vital signs stable and route. Asian denies shortness of breath, swelling or any chest pain  Patient is a 77 y.o. male presenting with syncope. The history is provided by the patient, medical records and the EMS personnel.  Loss of Consciousness    Past Medical History  Diagnosis Date  . Hypertension   . Hyperlipemia   . Cardiomyopathy     EF 15-20%... echo... September, 2011  . LBBB (left bundle branch block)   . Edema   .  Carotid bruit     Right 11/2010-Doppler: Mild plaque formation (B) w/o hemodynamically significant ICA stenosis, Mild to Moderate (L) ECA Stenosis  . Syncope     Nov.16, 2012, hospitalized [facility], etiology unclear, possibly vertigo component  . CHF (congestive heart failure)     Chronic systolic    Past Surgical History  Procedure Date  . Inguinal hernia repair     Left    No family history on file.  History  Substance Use Topics  . Smoking status: Former Smoker -- 1.5 packs/day for 10 years    Types: Cigarettes    Quit date: 10/29/1949  . Smokeless tobacco: Never Used  . Alcohol Use: No      Review of Systems  Cardiovascular: Positive for syncope.  All other systems reviewed and are negative.    Allergies  Review of patient's allergies indicates no known allergies.  Home Medications   Current Outpatient Rx  Name Route Sig Dispense Refill  . ASPIRIN EC 81 MG PO TBEC Oral Take 81 mg by mouth at bedtime.      [name] CARVEDILOL 6.25 MG PO TABS  TAKE ONE TABLET BY MOUTH TWICE DAILY 60 tablet 6  . FUROSEMIDE 20 MG PO TABS Oral Take 20 mg by mouth 2 (two) times daily.     [name] POTASSIUM CHLORIDE 20 MEQ PO PACK Oral Take 20 mEq by mouth daily.      [name] RAMIPRIL 5 MG PO CAPS Oral Take 5 mg by mouth daily.     [name] SIMVASTATIN 40 MG PO TABS Oral Take  40 mg by mouth at bedtime.        BP 116/95  Pulse 96  Resp 18  SpO2 95%  Physical Exam  Nursing note and vitals reviewed. Constitutional: He appears well-developed and well-nourished. No distress.  HENT:  Head: Normocephalic and atraumatic.  Mouth/Throat: Oropharynx is clear and moist. No oropharyngeal exudate.  Eyes: Conjunctivae and EOM are normal. Pupils are equal, round, and reactive to light. Right eye exhibits no discharge. Left eye exhibits no discharge. No scleral icterus.  Neck: Normal range of motion. Neck supple. No JVD present. No thyromegaly present.  Cardiovascular: Normal rate, regular rhythm, normal heart sounds  and intact distal pulses.  Exam reveals no gallop and no friction rub.   No murmur heard. Pulmonary/Chest: Effort normal and breath sounds normal. No respiratory distress. He has no wheezes. He has no rales. He exhibits no tenderness.  Abdominal: Soft. Bowel sounds are normal. He exhibits no distension and no mass. There is no tenderness.  Musculoskeletal: Normal range of motion. He exhibits no edema and no tenderness.  Lymphadenopathy:    He has no cervical adenopathy.  Neurological: He is alert. Coordination normal.       Neurologic exam:  Speech clear, pupils equal round reactive to light, extraocular movements intact  Normal peripheral visual fields Cranial nerves III through XII normal including no facial droop Follows commands, moves all extremities x4, normal strength to bilateral upper and lower extremities at all major muscle groups including grip Sensation normal to light touch and pinprick Coordination intact, no limb ataxia, finger-nose-finger normal Rapid alternating movements normal No pronator drift Gait normal   Skin: Skin is warm and dry. No rash noted. No erythema.  Psychiatric: He has a normal mood and affect. His behavior is normal.    ED Course  Procedures (including critical care time)  Labs Reviewed  BASIC METABOLIC PANEL - Abnormal; Notable for the following:    Potassium 5.2 (*)    Glucose, Bld 122 (*)    BUN 32 (*)    GFR calc non Af Amer 48 (*)    GFR calc Af Amer 55 (*)    All other components within normal limits  CBC  URINALYSIS, ROUTINE W REFLEX MICROSCOPIC  TROPONIN I   Ct Head Wo Contrast  10/29/2011  *RADIOLOGY REPORT*  Clinical Data: Syncope.  CT HEAD WITHOUT CONTRAST  Technique:  Contiguous axial images were obtained from the base of the skull through the vertex without contrast.  Comparison: 09/14/2011  Findings: The brain stem, cerebellum, cerebral peduncles, thalami, basal ganglia, basilar cisterns, and ventricular system appear  unremarkable.  No intracranial hemorrhage, mass lesion, or acute infarction is identified.  Prior right mastoidectomy noted, with chronic thickening along the right tympanic membrane.  IMPRESSION:  1.  No acute intracranial findings to explain the patient's syncope. 2.  Stable appearance of right mastoidectomy and chronic thickening along the right tympanic membrane.  Original Report Authenticated By: Dellia Cloud, M.D.     1. Syncope       MDM  EKG shows left bundle branch block at pulse of 61, no changes from prior EKG and normal vital signs. No focal neurologic findings and no complaints related to a cardiac nature. Complaint warrants another syncopal w/u and cardiac monitoring.    ED ECG REPORT   Date: 10/29/2011   Rate: 61  Rhythm: normal sinus rhythm  QRS Axis: left  Intervals: QRS prolonged and a left bundle branch pattern, PR interval increased  ST/T Wave  abnormalities: nonspecific ST and T changes consistent with repolarization abnormality  Conduction Disutrbances:left bundle branch block  Narrative Interpretation:   Old EKG Reviewed: unchangedNo change compared with 09/14/2011      Review of results shows no signs of electrolyte dysfunction, renal dysfunction, blood count abnormalities or dehydration. CT scan of the head shows no abnormal findings and troponin is normal.  I have discussed the care with his primary care physician Dr. Renard Matter who will admit the patient to the hospital for telemetry monitoring.   Javier Roller, MD 10/29/11 [phone]

## 2011-10-29 NOTE — Progress Notes (Signed)
Was told ed nurse will call me back for report

## 2011-10-29 NOTE — ED Notes (Signed)
Pt resting in bed, voices no complaints, wife at bedside.  Awaiting disposition.

## 2011-10-29 NOTE — ED Notes (Signed)
Ct and lab staff notified of  New orders

## 2011-10-29 NOTE — ED Notes (Signed)
Pt tolerated [place] sandwich well.  Attempted to call report to floor, nurse busy unable to take report.

## 2011-10-29 NOTE — ED Notes (Signed)
Attempted 2nd call to floor, nurse still busy, will return call. Delay explained to pt.

## 2011-10-30 ENCOUNTER — Encounter (HOSPITAL_COMMUNITY): Payer: Self-pay | Admitting: *Deleted

## 2011-10-30 MED ORDER — SODIUM CHLORIDE 0.9 % IV SOLN: 250.0000 mL | INTRAVENOUS | Status: DC | PRN

## 2011-10-30 MED ORDER — RAMIPRIL 2.5 MG PO CAPS
5.0000 mg | ORAL_CAPSULE | Freq: Every day | ORAL | Status: DC
  Administered 2011-10-30: 5 mg via ORAL
  Filled 2011-10-30: qty 2

## 2011-10-30 MED ORDER — SODIUM CHLORIDE 0.9 % IJ SOLN: 3.0000 mL | INTRAMUSCULAR | Status: DC | PRN

## 2011-10-30 MED ORDER — SODIUM CHLORIDE 0.9 % IJ SOLN
3.0000 mL | Freq: Two times a day (BID) | INTRAMUSCULAR | Status: DC
  Administered 2011-10-30 (×2): 3 mL via INTRAVENOUS
  Filled 2011-10-30 (×2): qty 3

## 2011-10-30 MED ORDER — CARVEDILOL 3.125 MG PO TABS
6.2500 mg | ORAL_TABLET | Freq: Two times a day (BID) | ORAL | Status: DC
  Administered 2011-10-30 (×2): 6.25 mg via ORAL
  Filled 2011-10-30: qty 2
  Filled 2011-10-30 (×2): qty 1

## 2011-10-30 MED ORDER — ASPIRIN 81 MG PO CHEW
81.0000 mg | CHEWABLE_TABLET | Freq: Every day | ORAL | Status: DC
  Administered 2011-10-30: 81 mg via ORAL
  Filled 2011-10-30 (×2): qty 1

## 2011-10-30 MED ORDER — FUROSEMIDE 20 MG PO TABS
20.0000 mg | ORAL_TABLET | Freq: Every day | ORAL | Status: DC
  Administered 2011-10-30: 20 mg via ORAL
  Filled 2011-10-30: qty 1

## 2011-10-30 MED ORDER — SIMVASTATIN 20 MG PO TABS
40.0000 mg | ORAL_TABLET | Freq: Every day | ORAL | Status: DC
  Administered 2011-10-30: 40 mg via ORAL
  Filled 2011-10-30: qty 2

## 2011-10-30 MED ORDER — BIOTENE DRY MOUTH MT LIQD
15.0000 mL | Freq: Two times a day (BID) | OROMUCOSAL | Status: DC
  Administered 2011-10-30: 15 mL via OROMUCOSAL

## 2011-10-30 NOTE — Progress Notes (Signed)
PIV removed without complaint, patient discharged home. Patient verbalizes understanding of discharge instructions and follow up care. Patient escorted out by staff, transported by family. 

## 2011-10-30 NOTE — Discharge Summary (Signed)
Javier Richards, Javier Richards                   ACCOUNT NO.:  [id number]  MEDICAL RECORD NO.:  [id number]  LOCATION:  A316                          FACILITY:  [facility]  PHYSICIAN:  Jenness Stemler G. Renard Matter, MD   DATE OF BIRTH:  Mar 26, 1921  DATE OF ADMISSION:  10/29/2011 DATE OF DISCHARGE:  01/01/2013LH                              DISCHARGE SUMMARY   DIAGNOSES: 1. Syncope of undetermined origin. 2. Left bundle-branch block. 3. History of hypertension. 4. Hyperlipidemia. 5. Cardiomyopathy with ejection fraction 15-20%.  CONDITION:  Stable and improved at the time of his discharge.  This patient was working in his yard carrying firewood into the house, had an episode of syncope, fell to the ground, and was unconscious for a short period of time.  This is followed by episode of nausea.  He apparently had an episode of this sort, previously had been followed by Cardiology in [place], Dr. [name].  He apparently wore an event recorder for period a time and we do not have the results of this, has known left bundle branch block and cardiomyopathy with ejection fraction 20-25%.  The patient was evaluated in the emergency department and it was felt that he should be observed overnight at least on telemetry.  PHYSICAL EXAMINATION:  VITAL SIGNS:  Alert male.  Blood pressure 125/62, pulse 93, temp 98, respirations 18. HEENT:  Eyes, PERRLA.  TMs negative.  Oropharynx benign. NECK:  Supple.  No JVD or thyroid abnormalities. HEART:  Regular rhythm.  No murmurs.  No cardiomegaly.  LUNGS:  Clear to P and A. ABDOMEN:  No palpable organs or masses.  No organomegaly.  EXTREMITIES: Free of edema. NEUROLOGICAL:  Cranial nerves intact.  No motor or sensory disturbance. Reflexes are equal and normal.  LABORATORY DATA:  CBC:  WBC 9500 with hemoglobin 13.9, hematocrit 42.4. Cardiac profile: MB 3.9, CPK 83, troponin less than 0.30.  Chemistries: Sodium 136, potassium 5.2, chloride 98, CO2 32, BUN 32, creatinine 1.29 calcium  10.4.  RADIOLOGY:  CT of the head, no acute intracranial findings to explain the patient's syncope.  Stable appearance of right mastoidectomy with chronic thickening along the right tympanic membrane.  HOSPITAL COURSE:  The patient was placed on telemetry.  He was given IV sodium chloride, was continued on Coreg 6.25 mg b.i.d., furosemide 20 mg daily, Altace 5 mg daily, simvastatin 40 mg daily, and aspirin 81 mg daily. The patient was placed on heart healthy diet.  He was monitored overnight and did not show any abnormality except for left bundle-branch block on his telemetry.  There, it was felt he could be discharged home and followed up as an outpatient by Dr. [name].     Delfina Schreurs G. Renard Matter, MD     AGM/MEDQ  D:  10/30/2011  T:  10/30/2011  Job:  [id number]

## 2011-11-06 ENCOUNTER — Encounter: Payer: Self-pay | Admitting: Cardiology

## 2011-11-07 ENCOUNTER — Ambulatory Visit (INDEPENDENT_AMBULATORY_CARE_PROVIDER_SITE_OTHER): Payer: Medicare Other | Admitting: Cardiology

## 2011-11-07 ENCOUNTER — Encounter: Payer: Self-pay | Admitting: Cardiology

## 2011-11-07 DIAGNOSIS — I5022 Chronic systolic (congestive) heart failure: Secondary | ICD-10-CM

## 2011-11-07 DIAGNOSIS — R55 Syncope and collapse: Secondary | ICD-10-CM

## 2011-11-07 DIAGNOSIS — I447 Left bundle-branch block, unspecified: Secondary | ICD-10-CM

## 2011-11-07 NOTE — Progress Notes (Signed)
HPI Patient is seen for cardiology followup. He has severe left ventricular dysfunction. However he has been quite stable and not having signs of heart failure. Unfortunately he has had recurrent syncope. He had worn an event recorder in early December, 2012. This showed sinus rhythm. There was no significant tachyarrhythmia. There was mild sinus bradycardia. On one occasion he had 8 beats of interventricular rhythm with a rate of 58. The patient then had a recurrent syncopal episode. He was carrying some firewood. He was admitted in [place]. No significant arrhythmias were seen as he was monitored. He was discharged home. Feels well now.  No Known Allergies  Current Outpatient Prescriptions  Medication Sig Dispense Refill  . aspirin EC 81 MG tablet Take 81 mg by mouth at bedtime.        . carvedilol (COREG) 6.25 MG tablet TAKE ONE TABLET BY MOUTH TWICE DAILY  60 tablet  6  . furosemide (LASIX) 20 MG tablet Take 20 mg by mouth 2 (two) times daily.       . potassium chloride (KLOR-CON) 20 MEQ packet Take 20 mEq by mouth daily.        . ramipril (ALTACE) 5 MG capsule Take 5 mg by mouth daily.       . simvastatin (ZOCOR) 40 MG tablet Take 40 mg by mouth at bedtime.          History   Social History  . Marital Status: Married    Spouse Name: N/A    Number of Children: N/A  . Years of Education: N/A   Occupational History  . Retired     Printmaker   Social History Main Topics  . Smoking status: Former Smoker -- 1.5 packs/day for 10 years    Types: Cigarettes    Quit date: 10/29/1949  . Smokeless tobacco: Former Neurosurgeon  . Alcohol Use: No  . Drug Use: No  . Sexually Active: Not on file   Other Topics Concern  . Not on file   Social History Narrative  . No narrative on file    No family history on file.  Past Medical History  Diagnosis Date  . Hypertension   . Hyperlipemia   . Cardiomyopathy     EF 15-20%... echo... September, 2011  . LBBB (left bundle branch block)   .  Edema   . Carotid bruit     Right 11/2010-Doppler: Mild plaque formation (B) w/o hemodynamically significant ICA stenosis, Mild to Moderate (L) ECA Stenosis  . Syncope     Nov.16, 2012, hospitalized [facility], etiology unclear, possibly vertigo component  . CHF (congestive heart failure)     Chronic systolic    Past Surgical History  Procedure Date  . Inguinal hernia repair     Left    ROS   Patient denies fever, chills, headache, sweats, rash, change in vision, change in hearing, chest pain, cough, nausea vomiting, urinary symptoms. All other systems are reviewed and are negative.  PHYSICAL EXAM  Patient really looks quite good. He is here with his wife. There is no jugulovenous distention. Lungs are clear. Respiratory effort is nonlabored. Cardiac exam reveals S1 and S2. There no clicks or significant murmurs. The abdomen is soft. There is no peripheral edema. There are no musculoskeletal deformities. There is no skin rash.  Filed Vitals:   11/07/11 1309  BP: 116/73  Pulse: 56  Height: 5\' 6"  (1.676 m)  Weight: 158 lb (71.668 kg)    EKG I have personally reviewed all  of the rhythm strips from the event recorder today. I outlined this in my note above.  ASSESSMENT & PLAN

## 2011-11-07 NOTE — Assessment & Plan Note (Signed)
I am very concerned about the patient's recurrent syncope. He has a significant cardiomyopathy. He is 78 years of age and I had not look into an ICD prior to this time. He has not had documented ventricular tachycardia or ventricular fibrillation. However he has left bundle branch block and severe left jugular dysfunction. I will refer him for EP evaluation. He lives a very meaningful life. His wife is with him today.

## 2011-11-07 NOTE — Assessment & Plan Note (Signed)
Patient CHF is under good control. No change in therapy.

## 2011-11-07 NOTE — Patient Instructions (Signed)
Follow up as scheduled with Dr. [name]. Establish with Dr. Johney Frame in [place]. We will call you with this appointment information.  Your physician recommends that you continue on your current medications as directed. Please refer to the Current Medication list given to you today.

## 2011-11-07 NOTE — Assessment & Plan Note (Signed)
Patient has left bundle branch block as outlined above. This is not new.

## 2011-11-10 ENCOUNTER — Inpatient Hospital Stay (HOSPITAL_COMMUNITY)
Admission: EM | Admit: 2011-11-10 | Discharge: 2011-11-11 | DRG: 309 | Disposition: A | Discharge status: Home / Self Care | Payer: Medicare Other | Attending: Family Medicine | Admitting: Family Medicine

## 2011-11-10 ENCOUNTER — Other Ambulatory Visit: Payer: Self-pay

## 2011-11-10 ENCOUNTER — Encounter (HOSPITAL_COMMUNITY): Payer: Self-pay | Admitting: *Deleted

## 2011-11-10 DIAGNOSIS — I509 Heart failure, unspecified: Secondary | ICD-10-CM | POA: Diagnosis present

## 2011-11-10 DIAGNOSIS — I2589 Other forms of chronic ischemic heart disease: Secondary | ICD-10-CM | POA: Diagnosis present

## 2011-11-10 DIAGNOSIS — E7800 Pure hypercholesterolemia, unspecified: Secondary | ICD-10-CM | POA: Diagnosis present

## 2011-11-10 DIAGNOSIS — I5022 Chronic systolic (congestive) heart failure: Secondary | ICD-10-CM | POA: Diagnosis present

## 2011-11-10 DIAGNOSIS — I10 Essential (primary) hypertension: Secondary | ICD-10-CM | POA: Diagnosis present

## 2011-11-10 DIAGNOSIS — I447 Left bundle-branch block, unspecified: Secondary | ICD-10-CM | POA: Diagnosis present

## 2011-11-10 DIAGNOSIS — I498 Other specified cardiac arrhythmias: Principal | ICD-10-CM | POA: Diagnosis present

## 2011-11-10 DIAGNOSIS — R55 Syncope and collapse: Secondary | ICD-10-CM

## 2011-11-10 LAB — BASIC METABOLIC PANEL
BUN: 29 mg/dL — ABNORMAL HIGH (ref 6–23)
Chloride: 98 mEq/L (ref 96–112)
Creatinine, Ser: 1.21 mg/dL (ref 0.50–1.35)
GFR calc Af Amer: 60 mL/min — ABNORMAL LOW (ref >90)
GFR calc Af Amer: 51 mL/min — ABNORMAL LOW (ref >90)
GFR calc non Af Amer: 44 mL/min — ABNORMAL LOW (ref >90)
Potassium: 4.5 mEq/L (ref 3.5–5.1)
Potassium: 4.6 mEq/L (ref 3.5–5.1)
Sodium: 136 mEq/L (ref 135–145)

## 2011-11-10 LAB — DIFFERENTIAL
Basophils Absolute: 0.1 10*3/uL (ref 0.0–0.1)
Basophils Absolute: 0 10*3/uL (ref 0.0–0.1)
Basophils Relative: 0 % (ref 0–1)
Lymphocytes Relative: 21 % (ref 12–46)
Lymphs Abs: 1.8 10*3/uL (ref 0.7–4.0)
Monocytes Relative: 7 % (ref 3–12)
Neutro Abs: 5.3 10*3/uL (ref 1.7–7.7)
Neutrophils Relative %: 68 % (ref 43–77)
Neutrophils Relative %: 71 % (ref 43–77)

## 2011-11-10 LAB — CBC
Hemoglobin: 14.4 g/dL (ref 13.0–17.0)
MCHC: 33.5 g/dL (ref 30.0–36.0)
Platelets: 215 10*3/uL (ref 150–400)
Platelets: 193 10*3/uL (ref 150–400)
RDW: 14.8 % (ref 11.5–15.5)
RDW: 14.8 % (ref 11.5–15.5)
WBC: 8.4 10*3/uL (ref 4.0–10.5)

## 2011-11-10 LAB — CARDIAC PANEL(CRET KIN+CKTOT+MB+TROPI)
Relative Index: INVALID (ref 0.0–2.5)
Total CK: 54 U/L (ref 7–232)

## 2011-11-10 MED ORDER — BIOTENE DRY MOUTH MT LIQD
15.0000 mL | Freq: Two times a day (BID) | OROMUCOSAL | Status: DC
  Administered 2011-11-11: 15 mL via OROMUCOSAL

## 2011-11-10 MED ORDER — FUROSEMIDE 20 MG PO TABS
20.0000 mg | ORAL_TABLET | Freq: Two times a day (BID) | ORAL | Status: DC
  Administered 2011-11-10 – 2011-11-11 (×2): 20 mg via ORAL
  Filled 2011-11-10 (×2): qty 1

## 2011-11-10 MED ORDER — SIMVASTATIN 20 MG PO TABS
40.0000 mg | ORAL_TABLET | Freq: Every day | ORAL | Status: DC
  Administered 2011-11-10: 40 mg via ORAL
  Filled 2011-11-10: qty 2
  Filled 2011-11-10: qty 1

## 2011-11-10 MED ORDER — RAMIPRIL 2.5 MG PO CAPS
5.0000 mg | ORAL_CAPSULE | Freq: Every day | ORAL | Status: DC
  Administered 2011-11-11: 5 mg via ORAL
  Filled 2011-11-10: qty 2

## 2011-11-10 MED ORDER — ASPIRIN 81 MG PO CHEW
81.0000 mg | CHEWABLE_TABLET | Freq: Every day | ORAL | Status: DC
  Administered 2011-11-11: 81 mg via ORAL
  Filled 2011-11-10: qty 1

## 2011-11-10 MED ORDER — POTASSIUM CHLORIDE 20 MEQ PO PACK
20.0000 meq | PACK | Freq: Every day | ORAL | Status: DC
  Administered 2011-11-11: 20 meq via ORAL
  Filled 2011-11-10 (×3): qty 1

## 2011-11-10 MED ORDER — CARVEDILOL 3.125 MG PO TABS: 6.2500 mg | ORAL_TABLET | Freq: Two times a day (BID) | ORAL | Status: DC

## 2011-11-10 MED ORDER — ENOXAPARIN SODIUM 40 MG/0.4ML ~~LOC~~ SOLN
40.0000 mg | SUBCUTANEOUS | Status: DC
  Administered 2011-11-10: 40 mg via SUBCUTANEOUS
  Filled 2011-11-10: qty 0.4

## 2011-11-10 MED ORDER — ACETAMINOPHEN 325 MG PO TABS: 650.0000 mg | ORAL_TABLET | ORAL | Status: DC | PRN

## 2011-11-10 NOTE — ED Notes (Signed)
Pt while here with his wife began to slump out of chair in room. Pt was aroused with verbal and physical stimuli. Pt then while talking began to loose consciousness again.

## 2011-11-10 NOTE — ED Notes (Signed)
Pt given lunch tray, update given on plan of care,  

## 2011-11-10 NOTE — ED Notes (Signed)
Pt hr decreased to 38 then would fluctuate between 38 to mid 60's, bp remains 96/52 pt alert, denies any symptoms. Dr. Adriana Simas notified, no additional orders given

## 2011-11-10 NOTE — ED Notes (Signed)
Dr. Sudie Bailey here to evaluate pt for admission

## 2011-11-10 NOTE — ED Notes (Signed)
Pt alert, able to answer all questions, states that he feels better,  pt talking on cell phone at present time,

## 2011-11-10 NOTE — ED Notes (Signed)
Pt states he has been having "black out spells" and has been seen by his pcp. Pt states he wore a monitor for a week. Pt under stress d/t wife in ed currently.

## 2011-11-10 NOTE — ED Provider Notes (Signed)
History  Scribed for Javier Hutching, MD, the patient was seen in [facility]. The chart was scribed by [name]. The patients care was started at 9:31 AM.   CSN: [id number]  Arrival date & time 11/10/11  4098   First MD Initiated Contact with Patient 11/10/11 [phone]      Chief Complaint  Patient presents with  . Loss of Consciousness    (Consider location/radiation/quality/duration/timing/severity/associated sxs/prior treatment) HPI Javier Richards is a 78 y.o. male who presents to the Emergency Department complaining of loss of consciousness. Pt was seen in the ED on 12/31 for similar symptoms. Pt is the husband of wife who is being seen in the ED today. While in ED room, pt began to slump out of chair. Pt was aroused with verbal and physical stimuli. Pt then while talking began to lose consciousness again. Pt states his "head doesn't feel right". Denies any chest pain. Reports he is not supposed to do any exertion, but may have exerted himself while worrying about his wife. There are no other associated symptoms and no other alleviating or aggravating factors.  Level V caveat for urgent need for intervention PCP: Dr. Renard Matter    Past Medical History  Diagnosis Date  . Hypertension   . Hyperlipemia   . Cardiomyopathy     EF 15-20%... echo... September, 2011  . LBBB (left bundle branch block)   . Edema   . Carotid bruit     Right 11/2010-Doppler: Mild plaque formation (B) w/o hemodynamically significant ICA stenosis, Mild to Moderate (L) ECA Stenosis  . Syncope     Nov.16, 2012, hospitalized [facility], etiology unclear, possibly vertigo component  . CHF (congestive heart failure)     Chronic systolic    Past Surgical History  Procedure Date  . Inguinal hernia repair     Left    History reviewed. No pertinent family history.  History  Substance Use Topics  . Smoking status: Former Smoker -- 1.5 packs/day for 10 years    Types: Cigarettes    Quit date: 10/29/1949  . Smokeless  tobacco: Former Neurosurgeon  . Alcohol Use: No      Review of Systems  Unable to perform ROS: Other  Neurological: Positive for syncope.  All other systems reviewed and are negative.    Allergies  Review of patient's allergies indicates no known allergies.  Home Medications   Current Outpatient Rx  Name Route Sig Dispense Refill  . ASPIRIN EC 81 MG PO TBEC Oral Take 81 mg by mouth at bedtime.      [name] CARVEDILOL 6.25 MG PO TABS  TAKE ONE TABLET BY MOUTH TWICE DAILY 60 tablet 6  . FUROSEMIDE 20 MG PO TABS Oral Take 20 mg by mouth 2 (two) times daily.     [name] POTASSIUM CHLORIDE 20 MEQ PO PACK Oral Take 20 mEq by mouth daily.      [name] RAMIPRIL 5 MG PO CAPS Oral Take 5 mg by mouth daily.     [name] SIMVASTATIN 40 MG PO TABS Oral Take 40 mg by mouth at bedtime.        BP 127/63  Pulse 102  Temp(Src) 97.7 F (36.5 C) (Oral)  Resp 16  Ht 5\' 6"  (1.676 m)  Wt 155 lb (70.308 kg)  BMI 25.02 kg/m2  SpO2 99%  Physical Exam  Constitutional: He is oriented to person, place, and time. He appears well-developed and well-nourished.  Non-toxic appearance. He does not have a sickly appearance.  HENT:  Head: Normocephalic and atraumatic.  Eyes: Conjunctivae, EOM and lids are normal. Pupils are equal, round, and reactive to light.  Neck: Trachea normal, normal range of motion and full passive range of motion without pain. Neck supple.  Cardiovascular: Regular rhythm and normal heart sounds.   Pulmonary/Chest: Effort normal and breath sounds normal. No respiratory distress.  Abdominal: Soft. Normal appearance. He exhibits no distension. There is no tenderness. There is no rebound and no CVA tenderness.  Musculoskeletal: Normal range of motion.  Neurological: He is alert and oriented to person, place, and time. He has normal strength.  Skin: Skin is warm, dry and intact. No rash noted.    ED Course  Procedures (including critical care time)  Labs Reviewed  BASIC METABOLIC PANEL - Abnormal; Notable for  the following:    Glucose, Bld 103 (*)    BUN 29 (*)    GFR calc non Af Amer 52 (*)    GFR calc Af Amer 60 (*)    All other components within normal limits  CARDIAC PANEL(CRET KIN+CKTOT+MB+TROPI) - Abnormal; Notable for the following:    CK, MB 4.4 (*)    All other components within normal limits  CBC  DIFFERENTIAL  POCT I-STAT TROPONIN I   Date: 11/10/2011  Rate: 48  Rhythm: sinus bradycardia  QRS Axis: normal  Intervals: QT prolonged  ST/T Wave abnormalities: normal  Conduction Disutrbances:nonspecific intraventricular conduction delay  Narrative Interpretation:   Old EKG Reviewed: changes noted  No results found.   No diagnosis found.  DIAGNOSTIC STUDIES: Oxygen Saturation is 99% on [place], normal by my interpretation.     LABS Results for orders placed during the hospital encounter of 11/10/11  CBC      Component Value Range   WBC 8.4  4.0 - 10.5 (K/uL)   RBC 4.64  4.22 - 5.81 (MIL/uL)   Hemoglobin 14.6  13.0 - 17.0 (g/dL)   HCT 16.1  09.6 - 04.5 (%)   MCV 94.0  78.0 - 100.0 (fL)   MCH 31.5  26.0 - 34.0 (pg)   MCHC 33.5  30.0 - 36.0 (g/dL)   RDW 40.9  81.1 - 91.4 (%)   Platelets 215  150 - 400 (K/uL)  DIFFERENTIAL      Component Value Range   Neutrophils Relative 68  43 - 77 (%)   Lymphocytes Relative 21  12 - 46 (%)   Monocytes Relative 7  3 - 12 (%)   Eosinophils Relative 3  0 - 5 (%)   Basophils Relative 1  0 - 1 (%)   Neutro Abs 5.6  1.7 - 7.7 (K/uL)   Lymphs Abs 1.8  0.7 - 4.0 (K/uL)   Monocytes Absolute 0.6  0.1 - 1.0 (K/uL)   Eosinophils Absolute 0.3  0.0 - 0.7 (K/uL)   Basophils Absolute 0.1  0.0 - 0.1 (K/uL)   WBC Morphology ATYPICAL LYMPHOCYTES    BASIC METABOLIC PANEL      Component Value Range   Sodium 136  135 - 145 (mEq/L)   Potassium 4.5  3.5 - 5.1 (mEq/L)   Chloride 98  96 - 112 (mEq/L)   CO2 32  19 - 32 (mEq/L)   Glucose, Bld 103 (*) 70 - 99 (mg/dL)   BUN 29 (*) 6 - 23 (mg/dL)   Creatinine, Ser 7.82  0.50 - 1.35 (mg/dL)   Calcium 95.6   8.4 - 10.5 (mg/dL)   GFR calc non Af Amer 52 (*) >90 (mL/min)   GFR  calc Af Amer 60 (*) >90 (mL/min)  CARDIAC PANEL(CRET KIN+CKTOT+MB+TROPI)      Component Value Range   Total CK 54  7 - 232 (U/L)   CK, MB 4.4 (*) 0.3 - 4.0 (ng/mL)   Troponin I <0.30  <0.30 (ng/mL)   Relative Index RELATIVE INDEX IS INVALID  0.0 - 2.5   POCT I-STAT TROPONIN I      Component Value Range   Troponin i, poc 0.01  0.00 - 0.08 (ng/mL)   Comment 3               COORDINATION OF CARE: 9:31am:  - Patient evaluated by ED physician, CBC, Diff, BMP, Cardia panel, EKG ordered 10:21am: Recheck by EDP. Pt reports improved symptoms. Plan discussed with pt and family.    Date: 11/10/2011  Rate: 50  Rhythm: sinus brady  QRS Axis: normal  Intervals: normal  ST/T Wave abnormalities: normal  Conduction Disutrbances:left bundle branch block  Narrative Interpretation:   Old EKG Reviewed: changes noted    MDM  Elderly male with syncope. EKG shows bradycardia and ectopy.  Will admit for monitoring   I personally performed the services described in this documentation, which was scribed in my presence. The recorded information has been reviewed and considered.        Javier Hutching, MD 11/10/11 1359

## 2011-11-10 NOTE — ED Notes (Signed)
Report given to Michelle, RN

## 2011-11-10 NOTE — Progress Notes (Signed)
Paged Dr. Sudie Bailey who is on call for Dr. Renard Matter. Orders received for admission. Pt alert and resting in bed with family member present at bedside. VS stable.

## 2011-11-11 NOTE — H&P (Signed)
Javier Richards, Javier Richards                   ACCOUNT NO.:  [id number]  MEDICAL RECORD NO.:  [id number]  LOCATION:  IC04                          FACILITY:  [facility]  PHYSICIAN:  Mila Homer. Sudie Bailey, M.D.DATE OF BIRTH:  [birth year]  DATE OF ADMISSION:  11/10/2011 DATE OF DISCHARGE:  01/13/2013LH                             HISTORY & PHYSICAL   This 78 year old was in the emergency room with his wife this morning, sitting down, when he had a syncopal episode and landed on the floor.  This is the 3rd syncopal episode he has had.  The last one was several weeks ago, when he was exerting himself, emptying the ashes from his chimney working outside.  He did see his cardiologist, Dr. Dietrich Pates, this last week, and arrangements were made for the patient to see another doctor this coming Monday, 2 days from now.  On this occasion his wife who was developed symptoms of TIA and the patient was very upset, anxious and was doing every he could to get her to the hospital.  He actually got her in the car and drove up himself and after all the exertion and all, was sitting down and then passed out.  He had immediate evaluation in the ER.  CURRENT MEDICATIONS: 1. Coated aspirin 81 mg daily. 2. Carvedilol 6.25 mg twice a day. 3. Furosemide 20 mg twice a day. 4. Potassium chloride 20 mEq daily. 5. Ramipril 5 mg daily. 6. Simvastatin 40 mg at bedtime.  OTHER MEDICAL PROBLEMS:  In addition to syncope include: 1. Congestive heart failure. 2. Edema. 3. Left bundle branch block. 4. Cardiomyopathy. 5. Hyperlipidemia. 6. Hypertension. 7. He does have a history of a carotid bruit and in February 2012 a     Doppler was mild plaque formation. 8. He was also hospitalized at [facility] in     November 2012 for syncope; the etiology was unclear. 9. His congestive heart failure is systolic. 10.His cardiomyopathy was evidenced by an echocardiogram in September     2011 showed an ejection fraction of  15-20%.  SURGERY:  Limited to a left inguinal hernia repair.  SOCIAL HISTORY:  He is currently married and lives with his wife a noted.  He is a former cigarette smoker, who smoked 1-1/2 packs a day for about 10 years but stopped in 1951.  He has also used smokeless tobacco in the past.  He does not use alcohol or drugs.  PHYSICAL EXAM:  GENERAL:  Showed a pleasant elderly man, who was sitting up in bed and seemed to give a very good history.  His speech was normal without slurring.  His reaction time seemed to be quick. VITAL SIGNS:  His temperature is 97.7, pulse 52, respiratory 14, blood pressure 136/81.  His pulse has ranged from 38-102 since the monitoring began.  His blood pressures range from 96/52 to 136/81.  His O2 sat currently is 100%. SKIN:  His color is good. HEENT:  Mucous membranes are moist. LYMPH:  He had no axillary, supraclavicular, or anterior cervical adenopathy. HEART:  Fairly regular rhythm, rate of about 70 on my exam. CHEST:  His lungs appeared to be fairly clear  throughout without intercostal retraction or use of accessory muscles of respiration. ABDOMEN:  Soft without organomegaly, mass or tenderness. EXTREMITIES:  He had no edema of his ankles.  LABORATORY DATA:  His admission CBC showed a white cell count of 8400, of which 68% were neutrophils, 21 lymphs.  His hemoglobin was 14.6, and platelet count 215,000.  His CMP showed a BUN of 29, creatinine 1.21, glucose of 103, and CK-MB was 4.4 but a CK was only 54, and a troponin less than 30.  His UA is pending.  His last EKG was done October 29, 2011 and showed a sinus rhythm with premature atrial complexes, left bundle branch block, left axis deviation.  He had a recent CT of the head without contrast which showed no acute intracranial findings and a stable right mastoidectomy and chronic thickening along the right tympanic membrane.  This CT was similar to a CT done September 14, 2011.  Also of note, he  tells me that he had a cardiac monitor attached to him for about 3 weeks and apparently he had no syncope during that time. That monitor was just stopped within the last week.  ADMISSION DIAGNOSES: 1. Syncopal episode, which may be secondary to his cardiomyopathy,     bradycardia, etc. 2. Cardiomyopathy. 3. Benign essential hypertension. 4. Chronic systolic congestive heart failure. 5. Left bundle branch block. 6. Mild edema. 7. Hypercholesterolemia.  PLAN OF TREATMENT:  The plan of treatment includes Hep-Lock for IV access, cardiac monitoring, continue on current medications with oxygen as needed.  If he has absolutely no findings in 24 hours, I may discharge him home with his daughter so that the next day he can be seen by the specialist in [place] who may throw some light in this case.  He does have cardiac abnormalities noted on his monitor.  He will be kept here to be seen by [facility] in 2 days.  I discussed all this with the patient.     Mila Homer. Sudie Bailey, M.D.     SDK/MEDQ  D:  11/10/2011  T:  11/11/2011  Job:  [id number]

## 2011-11-11 NOTE — Progress Notes (Signed)
Pt to be discharged home. Family member present at bedside. All discharge instructions and information gone over with pt and family member. All questions and concerns answered.

## 2011-11-12 ENCOUNTER — Encounter (HOSPITAL_COMMUNITY): Payer: Self-pay | Admitting: Pharmacy Technician

## 2011-11-12 ENCOUNTER — Ambulatory Visit (INDEPENDENT_AMBULATORY_CARE_PROVIDER_SITE_OTHER): Payer: Medicare Other | Admitting: Internal Medicine

## 2011-11-12 ENCOUNTER — Encounter: Payer: Self-pay | Admitting: *Deleted

## 2011-11-12 ENCOUNTER — Encounter: Payer: Self-pay | Admitting: Internal Medicine

## 2011-11-12 VITALS — BP 118/64 | HR 55 | Ht 66.0 in | Wt 157.0 lb

## 2011-11-12 DIAGNOSIS — I447 Left bundle-branch block, unspecified: Secondary | ICD-10-CM

## 2011-11-12 DIAGNOSIS — I10 Essential (primary) hypertension: Secondary | ICD-10-CM

## 2011-11-12 DIAGNOSIS — I5022 Chronic systolic (congestive) heart failure: Secondary | ICD-10-CM

## 2011-11-12 DIAGNOSIS — I509 Heart failure, unspecified: Secondary | ICD-10-CM

## 2011-11-12 DIAGNOSIS — R55 Syncope and collapse: Secondary | ICD-10-CM

## 2011-11-12 LAB — CBC WITH DIFFERENTIAL/PLATELET
Basophils Relative: 0.4 % (ref 0.0–3.0)
Eosinophils Relative: 1.9 % (ref 0.0–5.0)
HCT: 44.2 % (ref 39.0–52.0)
Lymphs Abs: 1.2 10*3/uL (ref 0.7–4.0)
MCV: 95.8 fl (ref 78.0–100.0)
Monocytes Absolute: 0.8 10*3/uL (ref 0.1–1.0)
Monocytes Relative: 8.3 % (ref 3.0–12.0)
RBC: 4.62 Mil/uL (ref 4.22–5.81)
WBC: 9.2 10*3/uL (ref 4.5–10.5)

## 2011-11-12 LAB — BASIC METABOLIC PANEL
Chloride: 98 mEq/L (ref 96–112)
Potassium: 5.4 mEq/L — ABNORMAL HIGH (ref 3.5–5.1)
Sodium: 139 mEq/L (ref 135–145)

## 2011-11-12 LAB — PROTIME-INR: INR: 1.1 ratio — ABNORMAL HIGH (ref 0.8–1.0)

## 2011-11-12 MED ORDER — CEFAZOLIN SODIUM 1-5 GM-% IV SOLN: 1.0000 g | INTRAVENOUS | Status: DC

## 2011-11-12 MED ORDER — SODIUM CHLORIDE 0.9 % IR SOLN
80.0000 mg | Status: DC
  Filled 2011-11-12 (×2): qty 2

## 2011-11-12 MED ORDER — SODIUM CHLORIDE 0.45 % IV SOLN: INTRAVENOUS | Status: DC

## 2011-11-12 MED ORDER — SODIUM CHLORIDE 0.9 % IV SOLN
INTRAVENOUS | Status: DC
  Administered 2011-11-13: 11:00:00 via INTRAVENOUS

## 2011-11-12 MED ORDER — CHLORHEXIDINE GLUCONATE 4 % EX LIQD
60.0000 mL | Freq: Once | CUTANEOUS | Status: DC
  Filled 2011-11-12: qty 60

## 2011-11-12 NOTE — Discharge Summary (Signed)
[name], Javier Richards                   ACCOUNT NO.:  [id number]  MEDICAL RECORD NO.:  [id number]  LOCATION:  IC04                          FACILITY:  [facility]  PHYSICIAN:  Mila Homer. Sudie Bailey, M.D.DATE OF BIRTH:  [birth year]  DATE OF ADMISSION:  11/10/2011 DATE OF DISCHARGE:  01/13/2013LH                              DISCHARGE SUMMARY   HOSPITAL COURSE:  This 78 year old man was rushing about trying to get his wife to the [facility] emergency room yesterday morning and doing much more activity than he usually does.  By the time they got to the emergency room he was absolutely exhausted.  Sat down and apparently collapsed on the floor.  He was admitted due to this collapse.  He has had 2 other syncopal episodes in the past.  He had a benign 2 day hospitalization extending from January 12 to November 11, 2011.  His vital signs remained stable in the ICU.  His admission blood tests showed a BUN 29, creatinine 1.2, when rechecked 29 and 1.38.  His troponin was normal.  CBC looked good.  His glucose was 108.  MRSA was negative by PCR.  He was kept in the intensive care unit.  He was on a monitor.  I talked to nursing at time of discharge.  He had no events while in the hospital.  He was felt to be stable for discharge home on the 2nd day.  It is my feeling that the collapse may be secondary to his cardiomyopathy (he had ejection fraction of 15-20% in September 2011).  FINAL DISCHARGE DIAGNOSES: 1. Syncope and collapse. 2. Cardiomyopathy. 3. Benign essential hypertension. 4. Left bundle branch block. 5. Congestive heart failure. 6. Bradycardia.  DISCHARGE MEDICATIONS: 1. Enteric-coated aspirin 81 mg daily. 2. Carvedilol 6.25 mg b.i.d. 3. Furosemide 20 mg b.i.d. 4. KCl 20 mEq daily. 5. Ramipril 5 mg daily. 6. Simvastatin 40 mg daily.  One of the conditions about his discharge today was that his daughter would be with him the entire time including tonight and would  help take him down to [facility] to be seen by the (I presume) electrophysiologist with [facility] tomorrow.  We also talked about his bradycardia and apparently that has been stable over the years with heart rates usually around 50.     Mila Homer. Sudie Bailey, M.D.     SDK/MEDQ  D:  11/11/2011  T:  11/12/2011  Job:  [id number]

## 2011-11-12 NOTE — Patient Instructions (Signed)
Patient is going to have Bi-V Pacemaker on 11/12/11

## 2011-11-12 NOTE — Assessment & Plan Note (Signed)
The patient presents for Ep consultation regarding syncope.  The exact nature of his syncope is uncertain.  I have reviewed his event monitor which reveals no prolonged pauses or sustained arrhythmias.  He has persistent bradycardia as well as LBBB with first degree AV block, supporting a possible brady arrhythmia as the cause.  In addition, however, he has a depressed EF which could also raise concern for tachy arrhythmias.  He does not appear to be orthostatic today.  I had a long discussion with the patient today regarding his options of 1) watchful waiting, 2) biventricular pacemaker implantation, or 3) defibrillator implantation.  The patient, his wife, and I agree that given his advanced age, we should avoid ICD implantation.   Risks, benefits, alternatives to biventricular pacemaker implantation were therefore discussed in detail with the patient today. The patient understands that the risks include but are not limited to bleeding, infection, pneumothorax, perforation, tamponade, vascular damage, renal failure, MI, stroke, death,  and lead dislodgement and wishes to proceed. We will therefore schedule the procedure at the next available time.   There is clear evidence to support CRT-P as an alternative to CRT-D in elderly patients, with a mortality benefit described. I therefore think that this is the best approach to preserve his quality of life, prevent syncope, and hopefully improve his EF. If he is found to have tachyarrhythmias in the future, then medical management with AAD would be my recommendation at that time given his advanced age.  He is very much aware (from Dr [name] and myself) that he cannot drive for 6 months following a syncopal episode.

## 2011-11-12 NOTE — Progress Notes (Signed)
Primary Care Physician: MCINNIS,ANGUS G, MD Referring Physician:  Dr Katz   Javier Richards is a 78 y.o. male with a h/o nonischemic cardiomypathy who presents today for EP consultation regarding syncope.  He reports having several episodes of syncope over the past few months.  He states that these episodes occur when he "over extends" himself.  His initial episode occurred 11/12.  At that time, he was taking out ashes.  He was carrying a five gallon bucket down a hill and was walking back up the hill when he bacame dizzy and nauseated.  He collasped with LOC.  He was nauseated upon waking and had to crawl up his steps to get into the house.  He had associated emesis.  He was evaluated at [facility] and discharged. He states that 3-4 weeks later, he was carrying wood.  He was squatting at the fireplace when he again had LOC.  He was again hospitalized without identifiable cause. His most recent episode of syncope occurred 1 week ago.  He states that his wife had been hospitalized.  He was "rushing around" to get his wife to the hospital.  While seated in her room, he again collapsed.  He reports having a "rushing" sensation to his head prior to the event. He has been evaluated by Dr Katz.  He is now referred for EP consultation.   Past Medical History  Diagnosis Date  . Hypertension   . Hyperlipemia   . Cardiomyopathy     EF 15-20%... echo... September, 2011  . LBBB (left bundle branch block)   . Edema   . Carotid bruit     Right 11/2010-Doppler: Mild plaque formation (B) w/o hemodynamically significant ICA stenosis, Mild to Moderate (L) ECA Stenosis  . Syncope     Nov.16, 2012, hospitalized [place], etiology unclear, possibly vertigo component  . Chronic systolic dysfunction of left ventricle    Past Surgical History  Procedure Date  . Inguinal hernia repair     Left  . Mastoidectomy     Current Outpatient Prescriptions  Medication Sig Dispense Refill  . aspirin EC 81 MG tablet Take 81  mg by mouth at bedtime.        . carvedilol (COREG) 6.25 MG tablet TAKE ONE TABLET BY MOUTH TWICE DAILY  60 tablet  6  . furosemide (LASIX) 20 MG tablet Take 20 mg by mouth 2 (two) times daily.       . potassium chloride (KLOR-CON) 20 MEQ packet Take 20 mEq by mouth daily.        . ramipril (ALTACE) 5 MG capsule Take 5 mg by mouth daily.       . simvastatin (ZOCOR) 40 MG tablet Take 40 mg by mouth at bedtime.         No current facility-administered medications for this visit.   Facility-Administered Medications Ordered in Other Visits  Medication Dose Route Frequency Provider Last Rate Last Dose  . DISCONTD: acetaminophen (TYLENOL) tablet 650 mg  650 mg Oral Q4H PRN Stephen D Knowlton      . DISCONTD: antiseptic oral rinse (BIOTENE) solution 15 mL  15 mL Mouth Rinse BID Angus G McInnis   15 mL at 11/11/11 1033  . DISCONTD: aspirin chewable tablet 81 mg  81 mg Oral Daily Stephen D Knowlton   81 mg at 11/11/11 1033  . DISCONTD: carvedilol (COREG) tablet 6.25 mg  6.25 mg Oral BID WC Stephen D Knowlton      . DISCONTD: enoxaparin (LOVENOX)   injection 40 mg  40 mg Subcutaneous Q24H Stephen D Knowlton   40 mg at 11/10/11 1826  . DISCONTD: furosemide (LASIX) tablet 20 mg  20 mg Oral BID Stephen D Knowlton   20 mg at 11/11/11 1033  . DISCONTD: potassium chloride (KLOR-CON) packet 20 mEq  20 mEq Oral Daily Stephen D Knowlton   20 mEq at 11/11/11 1033  . DISCONTD: ramipril (ALTACE) capsule 5 mg  5 mg Oral Daily Stephen D Knowlton   5 mg at 11/11/11 1033  . DISCONTD: simvastatin (ZOCOR) tablet 40 mg  40 mg Oral q1800 Stephen D Knowlton   40 mg at 11/10/11 1826    No Known Allergies  History   Social History  . Marital Status: Married    Spouse Name: N/A    Number of Children: N/A  . Years of Education: N/A   Occupational History  . Retired     Government   Social History Main Topics  . Smoking status: Former Smoker -- 1.5 packs/day for 10 years    Types: Cigarettes    Quit date:  10/29/1949  . Smokeless tobacco: Former User  . Alcohol Use: No  . Drug Use: No  . Sexually Active: Not on file   Other Topics Concern  . Not on file   Social History Narrative   Lives in [place]    Family History  Problem Relation Age of Onset  . Cancer      ROS- All systems are reviewed and negative except as per the HPI above  Physical Exam: Filed Vitals:   11/12/11 0930 11/12/11 0931 11/12/11 0932 11/12/11 0934  BP: 118/64 123/63 108/63 118/64  Pulse: 55 50 58 55  Height:    5' 6" (1.676 m)  Weight:    157 lb (71.215 kg)    GEN- The patient is well appearing, alert and oriented x 3 today.   Head- normocephalic, atraumatic Eyes-  Sclera clear, conjunctiva pink Ears- hearing intact Oropharynx- clear Neck- supple, no JVP Lymph- no cervical lymphadenopathy Lungs- Clear to ausculation bilaterally, normal work of breathing Heart- Regular rate and rhythm, no murmurs, rubs or gallops, PMI not laterally displaced GI- soft, NT, ND, + BS Extremities- no clubbing, cyanosis, or edema MS- no significant deformity or atrophy Skin- no rash or lesion Psych- euthymic mood, full affect Neuro- strength and sensation are intact  EKG 11/10/11- sinus bradycardia V rate 48, PR 198, LBBB with QRS 174 Echo 10/04/11- EF 20-25%, mild AS Event monitor 12/12- reviewed  Assessment and Plan:  

## 2011-11-12 NOTE — Assessment & Plan Note (Signed)
As above.

## 2011-11-12 NOTE — Assessment & Plan Note (Signed)
Given recent syncope, I would not advice aggressive BP control at this time.

## 2011-11-12 NOTE — Assessment & Plan Note (Signed)
His medical regimen is optimized We will proceed with CRT-P as above

## 2011-11-13 ENCOUNTER — Ambulatory Visit (HOSPITAL_COMMUNITY)
Admission: RE | Admit: 2011-11-13 | Discharge: 2011-11-14 | Disposition: A | Discharge status: Home / Self Care | Payer: Medicare Other | Source: Ambulatory Visit | Attending: Internal Medicine | Admitting: Internal Medicine

## 2011-11-13 ENCOUNTER — Encounter (HOSPITAL_COMMUNITY): Payer: Self-pay | Admitting: General Practice

## 2011-11-13 ENCOUNTER — Encounter (HOSPITAL_COMMUNITY)
Admission: RE | Disposition: A | Discharge status: Home / Self Care | Payer: Self-pay | Source: Ambulatory Visit | Attending: Internal Medicine

## 2011-11-13 DIAGNOSIS — I428 Other cardiomyopathies: Secondary | ICD-10-CM | POA: Insufficient documentation

## 2011-11-13 DIAGNOSIS — I509 Heart failure, unspecified: Secondary | ICD-10-CM

## 2011-11-13 DIAGNOSIS — R55 Syncope and collapse: Secondary | ICD-10-CM | POA: Insufficient documentation

## 2011-11-13 DIAGNOSIS — E785 Hyperlipidemia, unspecified: Secondary | ICD-10-CM | POA: Insufficient documentation

## 2011-11-13 DIAGNOSIS — I10 Essential (primary) hypertension: Secondary | ICD-10-CM | POA: Insufficient documentation

## 2011-11-13 DIAGNOSIS — I447 Left bundle-branch block, unspecified: Secondary | ICD-10-CM | POA: Insufficient documentation

## 2011-11-13 HISTORY — PX: BI-VENTRICULAR PACEMAKER INSERTION: SHX5462

## 2011-11-13 HISTORY — DX: Unspecified hearing loss, unspecified ear: H91.90

## 2011-11-13 HISTORY — PX: OTHER SURGICAL HISTORY: SHX169

## 2011-11-13 LAB — BASIC METABOLIC PANEL
BUN: 31 mg/dL — ABNORMAL HIGH (ref 6–23)
CO2: 28 mEq/L (ref 19–32)
Calcium: 9.8 mg/dL (ref 8.4–10.5)
Chloride: 101 mEq/L (ref 96–112)
Creatinine, Ser: 1.08 mg/dL (ref 0.50–1.35)
Glucose, Bld: 99 mg/dL (ref 70–99)

## 2011-11-13 SURGERY — BI-VENTRICULAR PACEMAKER INSERTION (CRT-P)
Anesthesia: LOCAL

## 2011-11-13 MED ORDER — CEFAZOLIN SODIUM 1-5 GM-% IV SOLN
1.0000 g | Freq: Four times a day (QID) | INTRAVENOUS | Status: AC
  Administered 2011-11-13 – 2011-11-14 (×3): 1 g via INTRAVENOUS
  Filled 2011-11-13 (×4): qty 50

## 2011-11-13 MED ORDER — LIDOCAINE HCL (PF) 1 % IJ SOLN
INTRAMUSCULAR | Status: AC
  Filled 2011-11-13: qty 60

## 2011-11-13 MED ORDER — MIDAZOLAM HCL 5 MG/5ML IJ SOLN
INTRAMUSCULAR | Status: AC
  Filled 2011-11-13: qty 5

## 2011-11-13 MED ORDER — FENTANYL CITRATE 0.05 MG/ML IJ SOLN
INTRAMUSCULAR | Status: AC
  Filled 2011-11-13: qty 2

## 2011-11-13 MED ORDER — CEFAZOLIN SODIUM 1-5 GM-% IV SOLN
INTRAVENOUS | Status: AC
  Filled 2011-11-13: qty 50

## 2011-11-13 MED ORDER — SODIUM CHLORIDE 0.9 % IJ SOLN
3.0000 mL | Freq: Two times a day (BID) | INTRAMUSCULAR | Status: DC
  Administered 2011-11-13 – 2011-11-14 (×2): 3 mL via INTRAVENOUS

## 2011-11-13 MED ORDER — ACETAMINOPHEN 500 MG PO TABS
1000.0000 mg | ORAL_TABLET | Freq: Four times a day (QID) | ORAL | Status: DC
  Administered 2011-11-13 – 2011-11-14 (×3): 1000 mg via ORAL
  Filled 2011-11-13 (×4): qty 2

## 2011-11-13 MED ORDER — CARVEDILOL 6.25 MG PO TABS
6.2500 mg | ORAL_TABLET | Freq: Two times a day (BID) | ORAL | Status: DC
Start: 2011-11-13 — End: 2011-11-14
  Administered 2011-11-13 – 2011-11-14 (×2): 6.25 mg via ORAL
  Filled 2011-11-13 (×4): qty 1

## 2011-11-13 MED ORDER — ONDANSETRON HCL 4 MG/2ML IJ SOLN: 4.0000 mg | Freq: Four times a day (QID) | INTRAMUSCULAR | Status: DC | PRN

## 2011-11-13 MED ORDER — MUPIROCIN 2 % EX OINT
TOPICAL_OINTMENT | Freq: Two times a day (BID) | CUTANEOUS | Status: DC
  Administered 2011-11-13: 1 via NASAL
  Filled 2011-11-13: qty 22

## 2011-11-13 MED ORDER — HYDROCODONE-ACETAMINOPHEN 5-325 MG PO TABS: 1.0000 | ORAL_TABLET | ORAL | Status: DC | PRN

## 2011-11-13 MED ORDER — ACETAMINOPHEN 325 MG PO TABS
325.0000 mg | ORAL_TABLET | ORAL | Status: DC | PRN
  Filled 2011-11-13: qty 2

## 2011-11-13 MED ORDER — SODIUM CHLORIDE 0.9 % IJ SOLN: 3.0000 mL | INTRAMUSCULAR | Status: DC | PRN

## 2011-11-13 MED ORDER — FUROSEMIDE 20 MG PO TABS
20.0000 mg | ORAL_TABLET | Freq: Two times a day (BID) | ORAL | Status: DC
  Administered 2011-11-13 – 2011-11-14 (×2): 20 mg via ORAL
  Filled 2011-11-13 (×3): qty 1

## 2011-11-13 MED ORDER — SIMVASTATIN 40 MG PO TABS
40.0000 mg | ORAL_TABLET | Freq: Every day | ORAL | Status: DC
  Administered 2011-11-13: 40 mg via ORAL
  Filled 2011-11-13 (×2): qty 1

## 2011-11-13 MED ORDER — ASPIRIN EC 81 MG PO TBEC
81.0000 mg | DELAYED_RELEASE_TABLET | Freq: Every day | ORAL | Status: DC
  Administered 2011-11-13: 81 mg via ORAL
  Filled 2011-11-13 (×2): qty 1

## 2011-11-13 MED ORDER — CEFAZOLIN SODIUM 1-5 GM-% IV SOLN
INTRAVENOUS | Status: AC
  Administered 2011-11-13: 1 g via INTRAVENOUS
  Filled 2011-11-13: qty 50

## 2011-11-13 MED ORDER — MUPIROCIN 2 % EX OINT
TOPICAL_OINTMENT | CUTANEOUS | Status: AC
  Filled 2011-11-13: qty 22

## 2011-11-13 MED ORDER — HEPARIN (PORCINE) IN NACL 2-0.9 UNIT/ML-% IJ SOLN
INTRAMUSCULAR | Status: AC
  Filled 2011-11-13: qty 1000

## 2011-11-13 MED ORDER — RAMIPRIL 5 MG PO CAPS
5.0000 mg | ORAL_CAPSULE | Freq: Every day | ORAL | Status: DC
  Administered 2011-11-13 – 2011-11-14 (×2): 5 mg via ORAL
  Filled 2011-11-13 (×2): qty 1

## 2011-11-13 NOTE — Interval H&P Note (Signed)
History and Physical Interval Note:  11/13/2011 1:26 PM  Javier Richards  has presented today for surgery, with the diagnosis of Syncope, cardiomypathy, LBBB, and chronic systolic dysfunction  The various methods of treatment have been discussed with the patient and family. After consideration of risks, benefits and other options for treatment, the patient has consented to  Procedure(s): BI-VENTRICULAR PACEMAKER INSERTION (CRT-P) as a surgical intervention .  The patients' history has been reviewed, patient examined, no change in status, stable for surgery.  I have reviewed the patients' chart and labs.  Questions were answered to the patient's satisfaction.     [name]

## 2011-11-13 NOTE — Progress Notes (Signed)
11/11/11 1700 Nursing Note: Pt arrived to room with no complaints at this time. Pt stable left subclavian level 0,minimum swelling, no bleeding or hematoma. Pt does not verbalize  pain at this time. Pt lying in bed will continue to monitor pt. Sully Dyment Scientist, clinical (histocompatibility and immunogenetics).

## 2011-11-13 NOTE — Progress Notes (Signed)
Doing well s/p CRT-P implant. Will plan to discharge to home in the am  Resume home medications Routine wound care and follow-up No driving (pt aware) Device clinic for wound check and follow-up with me in 3 months.

## 2011-11-13 NOTE — Op Note (Signed)
SURGEON:  [name], MD      PREPROCEDURE DIAGNOSES:   1. Nonischemic cardiomyopathy.   2. New York Heart Association class III, heart failure chronically.   3. Left bundle-branch block.   4. Syncope     POSTPROCEDURE DIAGNOSES:   1. Nonischemic cardiomyopathy.   2. New York Heart Association class III heart failure chronically.   3. Left bundle-branch block.   4. Syncope     PROCEDURES:    1. Left upper extremity venography  2. Biventricular pacemaker implantation.     INTRODUCTION:  Javier Richards is a 78 y.o. male with a nonischemic CM (EF 15%), NYHA Class III CHF, and LBBB QRS morophology.  He has had prior syncope, felt likely to bradycardic in nature.  Given LBBB, the patient may be expected to benefit from resynchronization therapy.  I had a long discussion with him regarding either ICD implant or CRT-P.  He and his spouse were very clear in their decision for CRT-P implant today.  Given his advanced age, I agree with this decision. The patient has been treated with an optimal medical regimen but continues to have a depressed ejection fraction and NYHA Class III CHF symptoms.  he therefore  presents today for a biventricular pacemaker implantation.     DESCRIPTION OF PROCEDURE:  Informed written consent was obtained and the patient was brought to the electrophysiology lab in the fasting state. The patient required no sedation for the procedure today.  The patient's left chest was prepped and draped in the usual sterile fashion by the EP lab staff.  The skin overlying the left deltopectoral region was infiltrated with lidocaine for local analgesia.  A 5-cm incision was made over the left deltopectoral region.  A left subcutaneous pacemaker pocket was fashioned using a combination of sharp and blunt dissection.  Electrocautery was used to assure hemostasis.   Left Upper extremity Venography:  A venogram of the left upper extremity was performed which revealed a moderate sized left  axillary vein which emptied into a moderate sized left subclavian vein.    RA/RV Lead Placement: The left axillary vein was cannulated with fluoroscopic visualization.  Through the left axillary vein, a St. Jude Medical Tendril SDX, model 1610RU-04  (serial # [id number]  ) right atrial lead and a St. Jude Medical Isoflex, model [phone] (serial number [id number]) right ventricular defibrillator lead were advanced with fluoroscopic visualization into the right atrial appendage and right ventricular apex positions respectively.  Initial atrial lead P-waves measured 1.4 mV with an impedance of 499 ohms and a threshold of 0.9 volts at 0.5 milliseconds.  The right ventricular lead R-wave measured 26 mV with impedance of 670 ohms and a threshold of 0.5 volts at 0.5 milliseconds.   LV Lead Placement: A Medtronic MB-2 guide was advanced through the left axillary vein into the low lateral right atrium.  A Bard curved Damato catheter was introduced through the MB-2 guide and used to cannulate the coronary sinus.  Coronary sinus cannulation was confirmed with electrogram recording from the hexapolar catheter.  A coronary sinus selective venography balloon was advanced through the MB- 2 guide and advanced into the proximal portion of the coronary sinus.  A selective coronary sinus venogram was performed by hand injection of nonionic contrast.  This demonstrated a moderate sized posterolateral coronary sinus branch.  No other posterior branches were identified.  A Whisper CSJ wire was introduced through the transseptal sheath and advanced into the distal portion of the posterolateral branch.  A St. Jude Medical QuickFlex Micro model 1258T - 86 (serial number [id number]) lead was advanced through the MB-2 into the posterolateral branch.   This was  approximately one-thirds from the base to the apex in a very lateral position.  In this location, the left ventricular lead R-waves measured  16 mV with impedance of 935 ohms and a  threshold of 1.4 volt at 0.5  milliseconds in the bipolar configuration with no diaphragmatic  stimulation observed when pacing at 10 volts output.  The MB-2 guide was  therefore removed.     All three leads were secured to the pectoralis  fascia using #2 silk suture over the suture sleeves.  The pocket then  irrigated with copious gentamicin solution.  The leads were then  connected to a St. Jude Medical Anthem model CD 3210 CRT-P RF (serial  Number [id number]) biventricular pacemaker.  The pacemaker was placed into the  pocket.  The pocket was then closed in 2 layers with 2.0 Vicryl suture  for the subcutaneous and subcuticular layers.  Steri-Strips and a  sterile dressing were then applied.  The procedure was therefore considered completed.  There were no early apparhent complications.     CONCLUSIONS:   1. Nonischemic cardiomyopathy with Left bundle-branch block and chronic New York Heart Association class III heart failure and syncope in an advanced age gentleman.   2. Successful biventricular pacemaker implantation.   3. No early apparent complications.   Javier Fearing Adrion Menz,MD 11/13/2011 3:22 PM

## 2011-11-13 NOTE — Brief Op Note (Signed)
11/13/2011  3:10 PM  PATIENT:  Javier Richards  78 y.o. male  PRE-OPERATIVE DIAGNOSIS:  Syncope, LBBB, nonischemic CM, chronic systolic dysfunction POST-OPERATIVE DIAGNOSIS:  Syncope, LBBB, nonischemic CM, chronic systolic dysfunction  PROCEDURE:  Procedure(s): BI-VENTRICULAR PACEMAKER INSERTION (CRT-P)  SURGEON:  Surgeon(s): Gardiner Rhyme, MD  PHYSICIAN ASSISTANT:   ASSISTANTS: none   ANESTHESIA:   none  EBL:     BLOOD ADMINISTERED:none  DRAINS: none   LOCAL MEDICATIONS USED:  LIDOCAINE 6CC  SPECIMEN:  No Specimen  DISPOSITION OF SPECIMEN:  N/A  COUNTS:  YES  TOURNIQUET:  * No tourniquets in log *  DICTATION: .Note written in EPIC  PLAN OF CARE: Admit for overnight observation  PATIENT DISPOSITION:  PACU - hemodynamically stable.   Delay start of Pharmacological VTE agent (>24hrs) due to surgical blood loss or risk of bleeding:  {YES/NO/NOT APPLICABLE:20182

## 2011-11-13 NOTE — Brief Op Note (Signed)
11/13/2011  3:09 PM  PATIENT:  Javier Richards  78 y.o. male  PRE-OPERATIVE DIAGNOSIS:  Syncope  POST-OPERATIVE DIAGNOSIS:  * No post-op diagnosis entered *  PROCEDURE:  Procedure(s): BI-VENTRICULAR PACEMAKER INSERTION (CRT-P)  SURGEON:  Surgeon(s): Gardiner Rhyme, MD  PHYSICIAN ASSISTANT:   ASSISTANTS: none   ANESTHESIA:   none  EBL:     BLOOD ADMINISTERED:none  DRAINS: none   LOCAL MEDICATIONS USED:  LIDOCAINE 6CC  SPECIMEN:  No Specimen  DISPOSITION OF SPECIMEN:  N/A  COUNTS:  YES  TOURNIQUET:  * No tourniquets in log *  DICTATION: .Note written in EPIC  PLAN OF CARE: Admit for overnight observation  PATIENT DISPOSITION:  PACU - hemodynamically stable.   Delay start of Pharmacological VTE agent (>24hrs) due to surgical blood loss or risk of bleeding:  {YES/NO/NOT APPLICABLE:20182

## 2011-11-13 NOTE — H&P (View-Only) (Signed)
Primary Care Physician: Alice Reichert, MD Referring Physician:  Dr Donetta Potts is a 78 y.o. male with a h/o nonischemic cardiomypathy who presents today for EP consultation regarding syncope.  He reports having several episodes of syncope over the past few months.  He states that these episodes occur when he "over extends" himself.  His initial episode occurred 11/12.  At that time, he was taking out ashes.  He was carrying a five gallon bucket down a hill and was walking back up the hill when he bacame dizzy and nauseated.  He collasped with LOC.  He was nauseated upon waking and had to crawl up his steps to get into the house.  He had associated emesis.  He was evaluated at [facility] and discharged. He states that 3-4 weeks later, he was carrying wood.  He was squatting at the fireplace when he again had LOC.  He was again hospitalized without identifiable cause. His most recent episode of syncope occurred 1 week ago.  He states that his wife had been hospitalized.  He was "rushing around" to get his wife to the hospital.  While seated in her room, he again collapsed.  He reports having a "rushing" sensation to his head prior to the event. He has been evaluated by Dr [name].  He is now referred for EP consultation.   Past Medical History  Diagnosis Date  . Hypertension   . Hyperlipemia   . Cardiomyopathy     EF 15-20%... echo... September, 2011  . LBBB (left bundle branch block)   . Edema   . Carotid bruit     Right 11/2010-Doppler: Mild plaque formation (B) w/o hemodynamically significant ICA stenosis, Mild to Moderate (L) ECA Stenosis  . Syncope     Nov.16, 2012, hospitalized [facility], etiology unclear, possibly vertigo component  . Chronic systolic dysfunction of left ventricle    Past Surgical History  Procedure Date  . Inguinal hernia repair     Left  . Mastoidectomy     Current Outpatient Prescriptions  Medication Sig Dispense Refill  . aspirin EC 81 MG tablet Take 81  mg by mouth at bedtime.        . carvedilol (COREG) 6.25 MG tablet TAKE ONE TABLET BY MOUTH TWICE DAILY  60 tablet  6  . furosemide (LASIX) 20 MG tablet Take 20 mg by mouth 2 (two) times daily.       . potassium chloride (KLOR-CON) 20 MEQ packet Take 20 mEq by mouth daily.        . ramipril (ALTACE) 5 MG capsule Take 5 mg by mouth daily.       . simvastatin (ZOCOR) 40 MG tablet Take 40 mg by mouth at bedtime.         No current facility-administered medications for this visit.   Facility-Administered Medications Ordered in Other Visits  Medication Dose Route Frequency Provider Last Rate Last Dose  . DISCONTD: acetaminophen (TYLENOL) tablet 650 mg  650 mg Oral Q4H PRN Milana Obey      . DISCONTD: antiseptic oral rinse (BIOTENE) solution 15 mL  15 mL Mouth Rinse BID Angus G McInnis   15 mL at 11/11/11 1033  . DISCONTD: aspirin chewable tablet 81 mg  81 mg Oral Daily Mila Homer Knowlton   81 mg at 11/11/11 1033  . DISCONTD: carvedilol (COREG) tablet 6.25 mg  6.25 mg Oral BID WC Milana Obey      . DISCONTD: enoxaparin (LOVENOX)  injection 40 mg  40 mg Subcutaneous Q24H Mila Homer Knowlton   40 mg at 11/10/11 1826  . DISCONTD: furosemide (LASIX) tablet 20 mg  20 mg Oral BID Mila Homer Knowlton   20 mg at 11/11/11 1033  . DISCONTD: potassium chloride (KLOR-CON) packet 20 mEq  20 mEq Oral Daily Mila Homer Knowlton   20 mEq at 11/11/11 1033  . DISCONTD: ramipril (ALTACE) capsule 5 mg  5 mg Oral Daily Mila Homer Knowlton   5 mg at 11/11/11 1033  . DISCONTD: simvastatin (ZOCOR) tablet 40 mg  40 mg Oral q1800 Mila Homer Knowlton   40 mg at 11/10/11 1826    No Known Allergies  History   Social History  . Marital Status: Married    Spouse Name: N/A    Number of Children: N/A  . Years of Education: N/A   Occupational History  . Retired     Printmaker   Social History Main Topics  . Smoking status: Former Smoker -- 1.5 packs/day for 10 years    Types: Cigarettes    Quit date:  10/29/1949  . Smokeless tobacco: Former Neurosurgeon  . Alcohol Use: No  . Drug Use: No  . Sexually Active: Not on file   Other Topics Concern  . Not on file   Social History Narrative   Lives in [place]    Family History  Problem Relation Age of Onset  . Cancer      ROS- All systems are reviewed and negative except as per the HPI above  Physical Exam: Filed Vitals:   11/12/11 0930 11/12/11 0931 11/12/11 0932 11/12/11 0934  BP: 118/64 123/63 108/63 118/64  Pulse: 55 50 58 55  Height:    5\' 6"  (1.676 m)  Weight:    157 lb (71.215 kg)    GEN- The patient is well appearing, alert and oriented x 3 today.   Head- normocephalic, atraumatic Eyes-  Sclera clear, conjunctiva pink Ears- hearing intact Oropharynx- clear Neck- supple, no JVP Lymph- no cervical lymphadenopathy Lungs- Clear to ausculation bilaterally, normal work of breathing Heart- Regular rate and rhythm, no murmurs, rubs or gallops, PMI not laterally displaced GI- soft, NT, ND, + BS Extremities- no clubbing, cyanosis, or edema MS- no significant deformity or atrophy Skin- no rash or lesion Psych- euthymic mood, full affect Neuro- strength and sensation are intact  EKG 11/10/11- sinus bradycardia V rate 48, PR 198, LBBB with QRS 174 Echo 10/04/11- EF 20-25%, mild AS Event monitor 12/12- reviewed  Assessment and Plan:

## 2011-11-13 NOTE — Progress Notes (Signed)
11/13/11 1745 Nursing Note:Correction to date on previous progress note written by George Hugh RN. Correct date 11/13/11. George Hugh RN

## 2011-11-14 ENCOUNTER — Ambulatory Visit (HOSPITAL_COMMUNITY): Payer: Medicare Other

## 2011-11-14 ENCOUNTER — Other Ambulatory Visit: Payer: Self-pay

## 2011-11-14 NOTE — Discharge Summary (Signed)
ELECTROPHYSIOLOGY PROCEDURE DISCHARGE SUMMARY    Patient ID: Javier Richards,  MRN: [id number], DOB/AGE: 1921-01-27 79 y.o.  Admit date: 11/13/2011 Discharge date: 11/14/2011  Primary Care Physician: Butch Penny, MD Primary Cardiologist: Jerral Bonito, MD  Primary Discharge Diagnosis:  Syncope, cardiomyopathy, LBBB, and chronic systolic dysfunction status post CRT-P implantation this admission  Secondary Discharge Diagnosis:  1.  Hypertension 2.  Hyperlipidemia 3.  Carotid bruit-Right 11/2010-Doppler: Mild plaque formation (B) w/o hemodynamically significant ICA stenosis, Mild to Moderate (L) ECA Stenosis  4.  Right ear deafness  Procedures This Admission:  1.  Implantation of a cardiac resynchronization therapy pacemaker on 11-13-2011 by Dr Johney Frame.  The patient received a Public house manager CRT-P [phone]) with model number 2088 right atrial lead, 1948 right ventricular lead, and model number 1258 left ventricular lead. The patient had no early apparent complications. 2.  Chest X-ray on 11-14-2011 demonstrated no pneumothorax status post device implantation.   Brief HPI: [name] is a 78 year old male with a history of nonischemic cardiomyopathy and syncope who was referred to Dr Johney Frame in the outpatient setting for treatment options.   It was felt that the exact nature of his syncope was uncertain.  Discussions were undertaken about the right course of action.  After discussions, it was felt that CRT-P was the right therapy to pursue.  If he is found to have tachyarrythmias in the future, then medical management with AAD would be Dr Jenel Lucks recommendation.  Hospital Course:  The patient was admitted on 11-13-2011 for planned implantation of a CRT-P.  This was carried out by Dr Johney Frame with details as outlined above.  He was monitored overnight on telemetry which demonstrated sinus rhythm with ventricular pacing.  His device was interrogated and found to be functioning normally. CXR was obtained  which demonstrated no pneumothorax status post device implantation.  His left chest was without hematoma or ecchymosis.  Dr Graciela Husbands examined the patient on 11-14-2011 and considered him stable for discharge.   Discharge Vitals: Blood pressure 103/52, pulse 64, temperature 97.2 F (36.2 C), temperature source Oral, resp. rate 18, height 5\' 6"  (1.676 m), weight 156 lb 15.5 oz (71.2 kg), SpO2 93.00%.   Labs:   Lab Results  Component Value Date   WBC 9.2 11/12/2011   HGB 15.0 11/12/2011   HCT 44.2 11/12/2011   MCV 95.8 11/12/2011   PLT 208.0 11/12/2011    Lab 11/13/11 1021  NA 140  K 4.9  CL 101  CO2 28  BUN 31*  CREATININE 1.08  CALCIUM 9.8  PROT --  BILITOT --  ALKPHOS --  ALT --  AST --  GLUCOSE 99     Discharge Medications: Current Discharge Medication List    CONTINUE these medications which have NOT CHANGED   Details  aspirin EC 81 MG tablet Take 81 mg by mouth at bedtime.      carvedilol (COREG) 6.25 MG tablet TAKE ONE TABLET BY MOUTH TWICE DAILY Qty: 60 tablet, Refills: 6    furosemide (LASIX) 20 MG tablet Take 20 mg by mouth 2 (two) times daily.     potassium chloride (KLOR-CON) 20 MEQ packet Take 20 mEq by mouth daily.      ramipril (ALTACE) 5 MG capsule Take 5 mg by mouth daily.     simvastatin (ZOCOR) 40 MG tablet Take 40 mg by mouth at bedtime.          Disposition:  Discharge Orders    Future Appointments: Provider:  Department: Dept Phone: Center:   11/26/2011 3:00 PM Vella Kohler [facility] [phone] [facility]   01/09/2012 10:30 AM Luis Abed, MD [facility] [phone] [id number]     Follow-up Information    Follow up with [facility] on 11/26/2011. (3:00)    Contact information:   [address] [place] [phone]      Follow up with [name], MD. (approx 3 months - we will arrange)    Contact information:   [address], [address] [place]  [phone]          Duration of Discharge Encounter: Greater than 30 minutes including physician time.  Signed, Gypsy Balsam, RN, BSN 11/14/2011, 10:35 AM

## 2011-11-14 NOTE — Progress Notes (Signed)
  Patient Name: Javier Richards      SUBJECTIVE without complaint except hungry  Past Medical History  Diagnosis Date  . Hypertension   . Hyperlipemia   . Cardiomyopathy     EF 15-20%... echo... September, 2011  . LBBB (left bundle branch block)   . Edema   . Carotid bruit     Right 11/2010-Doppler: Mild plaque formation (B) w/o hemodynamically significant ICA stenosis, Mild to Moderate (L) ECA Stenosis  . Syncope     Nov.16, 2012, hospitalized [facility], etiology unclear, possibly vertigo component  . Chronic systolic dysfunction of left ventricle   . Deafness     right  . CHF (congestive heart failure)   . Shortness of breath on exertion     PHYSICAL EXAM Filed Vitals:   11/13/11 1500 11/13/11 2042 11/14/11 0524 11/14/11 0633  BP: 131/66 106/55 93/54 103/52  Pulse: 70 60 59 64  Temp: 97.4 F (36.3 C) 98 F (36.7 C) 97.2 F (36.2 C)   TempSrc: Oral Oral Oral   Resp: 19 18 18    Height: 5\' 6"  (1.676 m)     Weight: 163 lb 9.3 oz (74.2 kg)  156 lb 15.5 oz (71.2 kg)   SpO2: 100% 97% 93%     Well developed and nourished in no acute distress HENT normal Neck supple with JVP-flat Carotids brisk and full without bruits Clear Irregularly irregular rate and rhythm with controlled  ventricular response, no murmurs or gallops Abd-soft with active BS without hepatomegaly No Clubbing cyanosis edema Skin-warm and dry A & Oriented  Grossly normal sensory and motor function   TELEMETRY: Reviewed telemetry pt in p synchronouas pacing   Intake/Output Summary (Last 24 hours) at 11/14/11 0847 Last data filed at 11/14/11 0630  Gross per 24 hour  Intake    633 ml  Output      0 ml  Net    633 ml    LABS: Basic Metabolic Panel:  Lab 11/13/11 1610 11/12/11 1046 11/10/11 1700 11/10/11 0936  NA 140 139 136 136  K 4.9 5.4 Repeated and verified X2.* 4.6 4.5  CL 101 98 98 98  CO2 28 26 34* 32  GLUCOSE 99 112* 108* 103*  BUN 31* 31* 29* 29*  CREATININE 1.08 1.3 1.38* 1.21    CALCIUM 9.8 9.5 -- --  MG -- -- -- --  PHOS -- -- -- --   Cardiac Enzymes: No results found for this basename: CKTOTAL:3,CKMB:3,CKMBINDEX:3,TROPONINI:3 in the last 72 hours CBC:  Lab 11/12/11 1046 11/10/11 1700 11/10/11 0936  WBC 9.2 7.4 8.4  NEUTROABS 7.1 5.3 5.6  HGB 15.0 14.4 14.6  HCT 44.2 43.0 43.6  MCV 95.8 94.5 94.0  PLT 208.0 193 215   PROTIME:  Basename 11/12/11 1046  LABPROT 11.8  INR 1.1*     ASSESSMENT AND PLAN: S/p CRT  Discharge to home  Wound check and bmet in 2 weeks  F/u per protocol  Signed, Sherryl Manges MD  11/14/2011

## 2011-11-14 NOTE — Progress Notes (Signed)
11/14/11 0959 Nursing Note: Pt to be discharged per MD's order. Pt is stable with no complaints at this time. IV and cardiac monitor discontinued. Pt's family called and pt awaiting family pickup. Pt received all discharge information including post pacer insertion instructions. Pt verbalizes understanding. Will continue to monitor pt. Shakita Keir Scientist, clinical (histocompatibility and immunogenetics).

## 2011-11-22 ENCOUNTER — Telehealth: Payer: Self-pay | Admitting: Internal Medicine

## 2011-11-26 ENCOUNTER — Encounter: Payer: Self-pay | Admitting: Internal Medicine

## 2011-11-26 ENCOUNTER — Ambulatory Visit (INDEPENDENT_AMBULATORY_CARE_PROVIDER_SITE_OTHER): Payer: Medicare Other | Admitting: *Deleted

## 2011-11-26 DIAGNOSIS — I498 Other specified cardiac arrhythmias: Secondary | ICD-10-CM

## 2011-11-26 DIAGNOSIS — I509 Heart failure, unspecified: Secondary | ICD-10-CM

## 2011-11-26 LAB — PACEMAKER DEVICE OBSERVATION
AL IMPEDENCE PM: 475 Ohm
AL THRESHOLD: 0.5 V
BAMS-0001: 150 {beats}/min
DEVICE MODEL PM: 2725920
LV LEAD THRESHOLD: 0.625 V
RV LEAD AMPLITUDE: 12 mv

## 2011-11-26 NOTE — Progress Notes (Signed)
Wound check-PPM 

## 2011-12-04 ENCOUNTER — Telehealth: Payer: Self-pay | Admitting: *Deleted

## 2011-12-04 NOTE — Telephone Encounter (Signed)
Pt left message asking for a return call by nurse.  Pt states since he saw Dr. [name] he had PTVP placed. He wanted to confirm his appts. He also states when he saw Dr. [name] last he wasn't supposed to drive, but Dr. Johney Frame told him he could drive after the 16XW. Pt wanted to make sure this is ok. Pt notified he can follow Dr. Jenel Lucks instructions regarding driving. Pt verbalized understanding.

## 2011-12-13 ENCOUNTER — Encounter: Payer: Self-pay | Admitting: Cardiology

## 2011-12-13 ENCOUNTER — Encounter: Payer: Self-pay | Admitting: *Deleted

## 2011-12-26 ENCOUNTER — Encounter: Payer: Self-pay | Admitting: *Deleted

## 2011-12-26 NOTE — Telephone Encounter (Signed)
This encounter was created in error - please disregard.

## 2011-12-28 ENCOUNTER — Telehealth: Payer: Self-pay | Admitting: Internal Medicine

## 2011-12-28 NOTE — Telephone Encounter (Signed)
Pt calling re using his cell phone, I told him ok to use as long as uses side opposite device , but he's deaf in that ear, he did use it once because his car broke down and he had to call his wife, is this ok?

## 2011-12-31 NOTE — Telephone Encounter (Signed)
Patient c/o stomach pain, headache and fatigue with minimal exertion.  I will see him in the [facility] office 3/5/@ 11:30am. Patient is in agreement.

## 2012-01-01 ENCOUNTER — Encounter: Payer: Self-pay | Admitting: Internal Medicine

## 2012-01-01 ENCOUNTER — Ambulatory Visit (INDEPENDENT_AMBULATORY_CARE_PROVIDER_SITE_OTHER): Payer: Medicare Other | Admitting: *Deleted

## 2012-01-01 DIAGNOSIS — I5022 Chronic systolic (congestive) heart failure: Secondary | ICD-10-CM

## 2012-01-01 DIAGNOSIS — I428 Other cardiomyopathies: Secondary | ICD-10-CM

## 2012-01-01 LAB — PACEMAKER DEVICE OBSERVATION
ATRIAL PACING PM: 73
LV LEAD IMPEDENCE PM: 960 Ohm
VENTRICULAR PACING PM: 87

## 2012-01-01 NOTE — Progress Notes (Signed)
PPM interrogation only  

## 2012-01-02 ENCOUNTER — Encounter: Payer: Self-pay | Admitting: Internal Medicine

## 2012-01-02 ENCOUNTER — Other Ambulatory Visit: Payer: Self-pay | Admitting: *Deleted

## 2012-01-02 ENCOUNTER — Ambulatory Visit (INDEPENDENT_AMBULATORY_CARE_PROVIDER_SITE_OTHER): Payer: Medicare Other | Admitting: Internal Medicine

## 2012-01-02 DIAGNOSIS — I447 Left bundle-branch block, unspecified: Secondary | ICD-10-CM

## 2012-01-02 DIAGNOSIS — I428 Other cardiomyopathies: Secondary | ICD-10-CM

## 2012-01-02 DIAGNOSIS — R55 Syncope and collapse: Secondary | ICD-10-CM

## 2012-01-02 DIAGNOSIS — I5023 Acute on chronic systolic (congestive) heart failure: Secondary | ICD-10-CM

## 2012-01-02 DIAGNOSIS — I959 Hypotension, unspecified: Secondary | ICD-10-CM

## 2012-01-02 DIAGNOSIS — I4891 Unspecified atrial fibrillation: Secondary | ICD-10-CM

## 2012-01-02 LAB — PACEMAKER DEVICE OBSERVATION
AL AMPLITUDE: 0.7 mv
BAMS-0001: 150 {beats}/min
BAMS-0003: 70 {beats}/min
BATTERY VOLTAGE: 2.9629 V
VENTRICULAR PACING PM: 38

## 2012-01-02 MED ORDER — RIVAROXABAN 15 MG PO TABS: 15.0000 mg | ORAL_TABLET | Freq: Every day | ORAL | Status: DC

## 2012-01-02 NOTE — Assessment & Plan Note (Addendum)
The patient presents with new onset atrial fibrillation and symptoms of decreased exercise tolerance.  I suspect that his symptoms may be due to afib.  I think that he will require urgent cardioversion.  As he is clinically stable, we will plan to do this as an outpatient. I will initiated xarelto 15mg  daily today (baseline CrCl is 30-50) and obtain a CBC and BMET today. Check TFTs given new onset afib. He will be allowed to reach steady state over the next few days and then will present by TEE guided cardioversion at [facility] on Monday. IF he does not maintain sinus rhythm, he may benefit from initiation of antiarrhythmic medicine.  Given his advanced age and structural heart disease, I would recommend amiodarone.  I will not start amiodarone today as he has not been adequately anticoagulated. He is aware that should his symptoms worsen over the next few days, he should report to the hospital.

## 2012-01-02 NOTE — Assessment & Plan Note (Signed)
S/p CRT-P Normal device function See paceart

## 2012-01-02 NOTE — Assessment & Plan Note (Signed)
Resolved with CRT-P

## 2012-01-02 NOTE — Progress Notes (Signed)
PCP:  Alice Reichert, MD, MD Primary Cardiologist:  Dr [name]  The patient presents today for urgent followup.  He recently underwent BiV pacemaker implant and was feeling "great".  Over the past week, however he has had precipitous decline in exercise tolerance.  He reports that with minimal exertion, he becomes short of breath and fatigue.  He seems to feel reasonably well at rest.  He went to our [place] office yesterday and pacemaker interrogation revealed that he had converted to atrial fibrillation.  He remains in afib today.    Past Medical History  Diagnosis Date  . Hypertension   . Hyperlipemia   . Cardiomyopathy     EF 15-20%... echo... September, 2011  . LBBB (left bundle branch block)   . Edema   . Carotid bruit     Right 11/2010-Doppler: Mild plaque formation (B) w/o hemodynamically significant ICA stenosis, Mild to Moderate (L) ECA Stenosis  . Syncope     Nov.16, 2012, hospitalized [facility], etiology unclear, possibly vertigo component  . Chronic systolic dysfunction of left ventricle   . Deafness     right  . CHF (congestive heart failure)   . Shortness of breath on exertion   . Chronic renal insufficiency    Past Surgical History  Procedure Date  . Mastoidectomy ~ 1954    right  . Biventricular pacemaker implantation 11/13/11  . Inguinal hernia repair ~ 1974    Left  . Tonsillectomy 1945    Current Outpatient Prescriptions  Medication Sig Dispense Refill  . aspirin EC 81 MG tablet Take 81 mg by mouth at bedtime.        . carvedilol (COREG) 6.25 MG tablet TAKE ONE TABLET BY MOUTH TWICE DAILY  60 tablet  6  . furosemide (LASIX) 20 MG tablet Take 20 mg by mouth 2 (two) times daily.       . potassium chloride (KLOR-CON) 20 MEQ packet Take 20 mEq by mouth daily.        . ramipril (ALTACE) 5 MG capsule Take 5 mg by mouth daily.       . simvastatin (ZOCOR) 40 MG tablet Take 40 mg by mouth at bedtime.          No Known Allergies  History   Social History  .  Marital Status: Married    Spouse Name: N/A    Number of Children: N/A  . Years of Education: N/A   Occupational History  . Retired     Printmaker   Social History Main Topics  . Smoking status: Former Smoker -- 1.5 packs/day for 10 years    Types: Cigarettes    Quit date: 10/29/1949  . Smokeless tobacco: Former Neurosurgeon   Comment: "haven't smoked since ~ 1960's"  . Alcohol Use: No  . Drug Use: No  . Sexually Active: No   Other Topics Concern  . Not on file   Social History Narrative   Lives in [place]    Family History  Problem Relation Age of Onset  . Cancer      ROS-  All systems are reviewed and are negative except as outlined in the HPI above  Physical Exam: Filed Vitals:   01/02/12 0843  BP: 86/57  Pulse: 98  Height: 5\' 6"  (1.676 m)  Weight: 163 lb (73.936 kg)  SpO2: 98%    GEN- The patient is well appearing, alert and oriented x 3 today.   Head- normocephalic, atraumatic Eyes-  Sclera clear, conjunctiva pink Ears-  hearing intact Oropharynx- clear Neck- supple, JVP 9cm Pacemaker pocket is well healed Lungs- Clear to ausculation bilaterally, normal work of breathing Chest- pacemaker pocket is well healed Heart- irrr GI- soft, NT, ND, + BS Extremities- no clubbing, cyanosis, trace edema MS- no significant deformity or atrophy Skin- no rash or lesion Psych- euthymic mood, full affect Neuro- strength and sensation are intact  Pacemaker interrogation- reviewed in detail today,  See PACEART report ekg today reveals afib with elevated ventricular rates and underlying LBB with intermittent BiV pacing  Assessment and Plan:

## 2012-01-02 NOTE — Patient Instructions (Signed)
   Your physician wants you to have the following labs:  CBC, BMET, Liver function, TSH, & T4  Office will notify you of the results later today

## 2012-01-02 NOTE — Assessment & Plan Note (Signed)
Only mildly volume overloaded on exam, though weight is up from 1/13. We cannot adjust medicines presently due to hypotension Check BMET today Continue current lasix I think that cardioversion over the next few days is the most prudent strategy.

## 2012-01-07 ENCOUNTER — Telehealth: Payer: Self-pay | Admitting: *Deleted

## 2012-01-07 ENCOUNTER — Encounter: Payer: Self-pay | Admitting: *Deleted

## 2012-01-07 ENCOUNTER — Other Ambulatory Visit: Payer: Self-pay | Admitting: *Deleted

## 2012-01-07 ENCOUNTER — Encounter: Payer: Self-pay | Admitting: Internal Medicine

## 2012-01-07 DIAGNOSIS — Z01810 Encounter for preprocedural cardiovascular examination: Secondary | ICD-10-CM

## 2012-01-07 DIAGNOSIS — I4891 Unspecified atrial fibrillation: Secondary | ICD-10-CM

## 2012-01-07 NOTE — Telephone Encounter (Signed)
TEE / DCCV scheduled for tomorrow, 3/12 at 1:00 w/ GD.

## 2012-01-07 NOTE — Telephone Encounter (Signed)
Pt has Medicare and Federal BCBS, no precert required. °

## 2012-01-08 ENCOUNTER — Encounter: Payer: Self-pay | Admitting: *Deleted

## 2012-01-08 NOTE — Progress Notes (Signed)
Patient ID: Javier Richards, male   DOB: 1920/11/13, 78 y.o.   MRN: [id number]  Below taken care of.     Filbert Schilder R << Less Detail     June Leap, MD       Sent: Thu January 03, 2012  4:43 PM    To: Lesle Chris, LPN          Message     [name] make sure we have pacemaker  rep there because patient has BiV.  ----- Message -----    From: Gardiner Rhyme, MD    Sent: 01/02/2012   1:13 PM      To: [name], MD, Luis Abed, MD  [name]  I have initiated Javier Richards on Xarelto today.  He is very symptomatic in afib and I am concerned that he will not do well in afib.  I have asked Claris Gladden to set him up for TEE guieded cardioversion with you on Monday.  Thanks [name]      Attached Reports     The sender attached the following reports to this message:    Encounter report for 01/02/2012

## 2012-01-09 ENCOUNTER — Ambulatory Visit: Payer: Medicare Other | Admitting: Cardiology

## 2012-01-09 ENCOUNTER — Encounter: Payer: Self-pay | Admitting: Cardiology

## 2012-01-10 ENCOUNTER — Telehealth: Payer: Self-pay | Admitting: *Deleted

## 2012-01-10 NOTE — Telephone Encounter (Signed)
Pt states his Xarelto is $40/month and he really cannot afford this. He would like to know if there is an alternative such as ordering it online where it would be less expensive. Pt notified he does not qualify for savings card as he has Medicare and there is really no other option that I'm aware of other than switching to a generic such as warfarin. Pt does not want to do this b/c of having to have INR's checked. He states he will try to manage Xarelto until he comes up with something else.

## 2012-01-16 ENCOUNTER — Encounter: Payer: Self-pay | Admitting: Cardiovascular Disease

## 2012-01-16 ENCOUNTER — Ambulatory Visit (INDEPENDENT_AMBULATORY_CARE_PROVIDER_SITE_OTHER): Payer: Medicare Other | Admitting: Cardiovascular Disease

## 2012-01-16 VITALS — BP 111/63 | HR 66 | Ht 66.0 in | Wt 159.0 lb

## 2012-01-16 DIAGNOSIS — I428 Other cardiomyopathies: Secondary | ICD-10-CM

## 2012-01-16 DIAGNOSIS — I4891 Unspecified atrial fibrillation: Secondary | ICD-10-CM

## 2012-01-16 NOTE — Assessment & Plan Note (Signed)
The patient seems to be euvolemic at this time. Continue current dose of furosemide.

## 2012-01-16 NOTE — Patient Instructions (Signed)
Follow up in 3 months. Your physician recommends that you continue on your current medications as directed. Please refer to the Current Medication list given to you today. 

## 2012-01-16 NOTE — Assessment & Plan Note (Signed)
The patient converted spontaneously to normal sinus rhythm without the need for TEE and cardioversion. He feels back to his baseline. Seems that he was very symptomatic when he went into atrial fibrillation. I recommend continuing anticoagulation with Xarelto. He is having some difficulty in affording this medication. A 10 day supply is costing him $40. He does not want to go on warfarin. I recommend continuing current therapy. If he develops recurrent atrial fibrillation, I recommend starting him on amiodarone as well as outlined by Dr. Johney Frame.

## 2012-01-16 NOTE — Progress Notes (Signed)
HPI  This is an 78 year old male who is here today for a followup visit. He has a history of cardiomyopathy with left bundle branch block. He had a biventricular ICD placement in January of this year. Few weeks ago the patient complained of increased dyspnea, fatigue and dizziness. He was found to be in atrial fibrillation which was a new diagnosis. He was started on Xarelto 15 mg once daily. He was scheduled for TEE and cardioversion. However, on the day of the stent, he was found to be in normal sinus rhythm. He has been maintaining sinus rhythm since then. He actually feels much better. His dyspnea and dizziness have both resolved. He feels back to his baseline.  No Known Allergies   Current Outpatient Prescriptions on File Prior to Visit  Medication Sig Dispense Refill  . aspirin EC 81 MG tablet Take 81 mg by mouth at bedtime.        . carvedilol (COREG) 6.25 MG tablet TAKE ONE TABLET BY MOUTH TWICE DAILY  60 tablet  6  . furosemide (LASIX) 20 MG tablet Take 20 mg by mouth 2 (two) times daily.       . potassium chloride (KLOR-CON) 20 MEQ packet Take 20 mEq by mouth daily.        . ramipril (ALTACE) 5 MG capsule Take 5 mg by mouth daily.       . Rivaroxaban (XARELTO) 15 MG TABS tablet Take 1 tablet (15 mg total) by mouth daily.  10 tablet  0  . simvastatin (ZOCOR) 40 MG tablet Take 40 mg by mouth at bedtime.           Past Medical History  Diagnosis Date  . Hypertension   . Hyperlipemia   . Cardiomyopathy     EF 15-20%... echo... September, 2011  . Edema   . Carotid bruit     Right 11/2010-Doppler: Mild plaque formation (B) w/o hemodynamically significant ICA stenosis, Mild to Moderate (L) ECA Stenosis  . Syncope     Nov.16, 2012, hospitalized [facility], etiology unclear, possibly vertigo component  . Chronic systolic dysfunction of left ventricle   . Deafness     right  . Shortness of breath on exertion   . Chronic renal insufficiency   . CHF (congestive heart failure)     . LBBB (left bundle branch block)      Past Surgical History  Procedure Date  . Mastoidectomy ~ 1954    right  . Biventricular pacemaker implantation 11/13/11  . Inguinal hernia repair ~ 1974    Left  . Tonsillectomy 1945     Family History  Problem Relation Age of Onset  . Cancer       History   Social History  . Marital Status: Married    Spouse Name: N/A    Number of Children: N/A  . Years of Education: N/A   Occupational History  . Retired     Printmaker   Social History Main Topics  . Smoking status: Former Smoker -- 1.5 packs/day for 10 years    Types: Cigarettes    Quit date: 10/29/1949  . Smokeless tobacco: Former Neurosurgeon   Comment: "haven't smoked since ~ 1960's"  . Alcohol Use: No  . Drug Use: No  . Sexually Active: No   Other Topics Concern  . Not on file   Social History Narrative   Lives in [place]     PHYSICAL EXAM   BP 111/63  Pulse 66  Ht 5\' 6"  (1.676 m)  Wt 159 lb (72.122 kg)  BMI 25.66 kg/m2  SpO2 98% Constitutional: He is oriented to person, place, and time. He appears well-developed and well-nourished. No distress.  HENT: No nasal discharge.  Head: Normocephalic and atraumatic.  Eyes: Pupils are equal and round. Right eye exhibits no discharge. Left eye exhibits no discharge.  Neck: Normal range of motion. Neck supple. No JVD present. No thyromegaly present.  Cardiovascular: Normal rate, regular rhythm, normal heart sounds and. Exam reveals no gallop and no friction rub. No murmur heard.  Pulmonary/Chest: Effort normal and breath sounds normal. No stridor. No respiratory distress. He has no wheezes. He has no rales. He exhibits no tenderness.  Abdominal: Soft. Bowel sounds are normal. He exhibits no distension. There is no tenderness. There is no rebound and no guarding.  Musculoskeletal: Normal range of motion. He exhibits no edema and no tenderness.  Neurological: He is alert and oriented to person, place, and time.  Coordination normal.  Skin: Skin is warm and dry. No rash noted. He is not diaphoretic. No erythema. No pallor.  Psychiatric: He has a normal mood and affect. His behavior is normal. Judgment and thought content normal.       EKG: AV pacing.   ASSESSMENT AND PLAN

## 2012-01-17 ENCOUNTER — Ambulatory Visit: Payer: Self-pay | Admitting: Cardiology

## 2012-02-18 ENCOUNTER — Telehealth: Payer: Self-pay | Admitting: *Deleted

## 2012-02-18 NOTE — Telephone Encounter (Signed)
Pt left message on voicemail asking for a return call.  Pt states he's had a few days where he was like before where he was unable to go from one room to another. He states before Dr. Johney Frame told him this was from his a.fib. He feels better today and says has a f/u scheduled w/Dr. Johney Frame for next Monday. He will notify the office if symptoms continue or re-develop.

## 2012-02-22 ENCOUNTER — Encounter: Payer: Self-pay | Admitting: Physician Assistant

## 2012-02-22 ENCOUNTER — Telehealth: Payer: Self-pay | Admitting: *Deleted

## 2012-02-22 ENCOUNTER — Ambulatory Visit (INDEPENDENT_AMBULATORY_CARE_PROVIDER_SITE_OTHER): Payer: Medicare Other | Admitting: Physician Assistant

## 2012-02-22 ENCOUNTER — Other Ambulatory Visit: Payer: Self-pay | Admitting: Cardiology

## 2012-02-22 ENCOUNTER — Telehealth: Payer: Self-pay | Admitting: Internal Medicine

## 2012-02-22 ENCOUNTER — Encounter: Payer: Self-pay | Admitting: Internal Medicine

## 2012-02-22 VITALS — BP 107/76 | HR 128 | Ht 66.0 in | Wt 164.0 lb

## 2012-02-22 DIAGNOSIS — I4892 Unspecified atrial flutter: Secondary | ICD-10-CM

## 2012-02-22 DIAGNOSIS — I509 Heart failure, unspecified: Secondary | ICD-10-CM

## 2012-02-22 DIAGNOSIS — R0989 Other specified symptoms and signs involving the circulatory and respiratory systems: Secondary | ICD-10-CM

## 2012-02-22 DIAGNOSIS — R0600 Dyspnea, unspecified: Secondary | ICD-10-CM

## 2012-02-22 DIAGNOSIS — R0602 Shortness of breath: Secondary | ICD-10-CM

## 2012-02-22 NOTE — Telephone Encounter (Signed)
Spoke with Dr. Earnestine Leys by phone. Pt has contacted Dr. Johney Frame with c/o SOB. Dr. Johney Frame would like pt seen today. Per Dr. Earnestine Leys: Add pt to Gene's schedule. Have pt do CXR, CBC/BMET/BNP before appt. T.O. Dr. Earnestine Leys.   Pt notified and verbalized understanding. Orders faxed to [facility].

## 2012-02-22 NOTE — Assessment & Plan Note (Signed)
We suspected that the patient's symptoms is related to recurrent atrial flutter. Recommendation by Dr. Andee Lineman, therefore, is to proceed with DC cardioversion, which we will arrange with the ED here at [facility]. He may also need to be started on amiodarone, following the procedure.

## 2012-02-22 NOTE — Telephone Encounter (Signed)
New problem:  Per patient wife - hubsand need to be seen today. C/O  Having a hard time breathing & walking. Patient aware of the appt on Monday with Dr. Johney Frame.

## 2012-02-22 NOTE — Telephone Encounter (Signed)
Dr Johney Frame spoke with Gene Serpe PA about seeing patient today

## 2012-02-22 NOTE — Telephone Encounter (Signed)
Spoke w/Dr allred who in return talked with Gene Serpe. Pt to be scheduled in [place] office today for SOB.

## 2012-02-22 NOTE — Assessment & Plan Note (Signed)
Patient will be treated with a one-time dose of 40 mg IV Lasix, in the ED. Of note, potassium today was 5.7.

## 2012-02-22 NOTE — Progress Notes (Signed)
HPI: Patient presents as an add-on, per Dr. Jenel Lucks request, for evaluation of worsening dyspnea.   Patient presents with progressive DOE over the past 2 weeks. However, he reports no symptoms at rest. He also denies PND, orthopnea, or significant LE edema. He denies exertional CP or tachycardia palpitations.  EKG today is suggestive of atypical atrial tachycardia at approximately 1:30 bpm. This was reviewed with Dr. Andee Lineman.  CXR done today suggested possible right infrahilar mass, seen on lateral projection - recommended followup chest CT with contrast; mild/moderate CM; no pulmonary edema/pleural effusion.  Labs drawn today: BNP 2100, BUN 23, creatinine 1.6, and potassium 5.7. WBC 11 hemoglobin 13.9, hematocrit 42.7, and platelets 180.  No Known Allergies  Current Outpatient Prescriptions  Medication Sig Dispense Refill  . aspirin EC 81 MG tablet Take 81 mg by mouth at bedtime.        . carvedilol (COREG) 6.25 MG tablet TAKE ONE TABLET BY MOUTH TWICE DAILY  60 tablet  6  . furosemide (LASIX) 20 MG tablet Take 20 mg by mouth 2 (two) times daily.       . potassium chloride (KLOR-CON) 20 MEQ packet Take 20 mEq by mouth daily.        . ramipril (ALTACE) 5 MG capsule Take 5 mg by mouth daily.       . Rivaroxaban (XARELTO) 15 MG TABS tablet Take 1 tablet (15 mg total) by mouth daily.  10 tablet  0  . simvastatin (ZOCOR) 40 MG tablet Take 40 mg by mouth at bedtime.          Past Medical History  Diagnosis Date  . Hypertension   . Hyperlipemia   . Cardiomyopathy     EF 15-20%... echo... September, 2011  . Edema   . Carotid bruit     Right 11/2010-Doppler: Mild plaque formation (B) w/o hemodynamically significant ICA stenosis, Mild to Moderate (L) ECA Stenosis  . Syncope     Nov.16, 2012, hospitalized [facility], etiology unclear, possibly vertigo component  . Chronic systolic dysfunction of left ventricle   . Deafness     right  . Shortness of breath on exertion   . Chronic renal  insufficiency   . CHF (congestive heart failure)   . LBBB (left bundle branch block)     Past Surgical History  Procedure Date  . Mastoidectomy ~ 1954    right  . Biventricular pacemaker implantation 11/13/11  . Inguinal hernia repair ~ 1974    Left  . Tonsillectomy 1945    History   Social History  . Marital Status: Married    Spouse Name: N/A    Number of Children: N/A  . Years of Education: N/A   Occupational History  . Retired     Printmaker   Social History Main Topics  . Smoking status: Former Smoker -- 1.5 packs/day for 10 years    Types: Cigarettes    Quit date: 10/29/1949  . Smokeless tobacco: Former Neurosurgeon   Comment: "haven't smoked since ~ 1960's"  . Alcohol Use: No  . Drug Use: No  . Sexually Active: No   Other Topics Concern  . Not on file   Social History Narrative   Lives in [place]    Family History  Problem Relation Age of Onset  . Cancer      ROS: no nausea, vomiting; no fever, chills; no melena, hematochezia; no claudication  PHYSICAL EXAM: BP 107/76  Pulse 128  Ht 5\' 6"  (1.676 m)  Wt 164 lb (74.39 kg)  BMI 26.47 kg/m2 GENERAL: 78 year old male, sitting upright; NAD HEENT: NCAT, PERRLA, EOMI; sclera clear; no xanthelasma NECK: palpable bilateral carotid pulses, no bruits; JVD at 60; no TM LUNGS: Mild basilar crackles CARDIAC: Reg rhythm at increased rate (S1, S2); no significant murmurs; no rubs or gallops ABDOMEN: soft, non-tender; intact BS EXTREMETIES: 1+ peripheral peripheral edema SKIN: warm/dry; no obvious rash/lesions MUSCULOSKELETAL: no joint deformity NEURO: no focal deficit; NL affect   EKG: reviewed and available in Electronic Records   ASSESSMENT & PLAN:

## 2012-02-25 ENCOUNTER — Encounter: Payer: Medicare Other | Admitting: Internal Medicine

## 2012-02-25 ENCOUNTER — Other Ambulatory Visit: Payer: Self-pay | Admitting: Cardiology

## 2012-02-25 ENCOUNTER — Encounter: Payer: Self-pay | Admitting: Internal Medicine

## 2012-02-25 ENCOUNTER — Ambulatory Visit (INDEPENDENT_AMBULATORY_CARE_PROVIDER_SITE_OTHER): Payer: Medicare Other | Admitting: Internal Medicine

## 2012-02-25 VITALS — BP 120/67 | HR 64 | Resp 18 | Ht 63.0 in | Wt 169.0 lb

## 2012-02-25 DIAGNOSIS — I5022 Chronic systolic (congestive) heart failure: Secondary | ICD-10-CM

## 2012-02-25 DIAGNOSIS — I428 Other cardiomyopathies: Secondary | ICD-10-CM

## 2012-02-25 DIAGNOSIS — I5023 Acute on chronic systolic (congestive) heart failure: Secondary | ICD-10-CM

## 2012-02-25 DIAGNOSIS — I10 Essential (primary) hypertension: Secondary | ICD-10-CM

## 2012-02-25 DIAGNOSIS — I4891 Unspecified atrial fibrillation: Secondary | ICD-10-CM

## 2012-02-25 DIAGNOSIS — R55 Syncope and collapse: Secondary | ICD-10-CM

## 2012-02-25 DIAGNOSIS — I4892 Unspecified atrial flutter: Secondary | ICD-10-CM

## 2012-02-25 LAB — PACEMAKER DEVICE OBSERVATION
AL THRESHOLD: 0.75 V
ATRIAL PACING PM: 70
BAMS-0001: 150 {beats}/min
BAMS-0003: 70 {beats}/min
RV LEAD THRESHOLD: 0.875 V
VENTRICULAR PACING PM: 85

## 2012-02-25 LAB — CBC WITH DIFFERENTIAL/PLATELET
Basophils Absolute: 0 10*3/uL (ref 0.0–0.1)
Lymphocytes Relative: 11.2 % — ABNORMAL LOW (ref 12.0–46.0)
Lymphs Abs: 0.9 10*3/uL (ref 0.7–4.0)
Monocytes Relative: 9.7 % (ref 3.0–12.0)
Platelets: 155 10*3/uL (ref 150.0–400.0)
RDW: 14.2 % (ref 11.5–14.6)

## 2012-02-25 LAB — BASIC METABOLIC PANEL
BUN: 30 mg/dL — ABNORMAL HIGH (ref 6–23)
Calcium: 9.2 mg/dL (ref 8.4–10.5)
Creatinine, Ser: 1.4 mg/dL (ref 0.4–1.5)
GFR: 49.91 mL/min — ABNORMAL LOW (ref >60.00)

## 2012-02-25 MED ORDER — RIVAROXABAN 15 MG PO TABS: 15.0000 mg | ORAL_TABLET | Freq: Every day | ORAL | Status: DC

## 2012-02-25 NOTE — Patient Instructions (Signed)
Your physician recommends that you schedule a follow-up appointment in: Jan 2013 in [place] office  Your physician has recommended you make the following change in your medication:  1) Continue to take the Amiodarone 200mg  twice daily for 4 weeks then decrease to 200mg  daily

## 2012-02-25 NOTE — Progress Notes (Signed)
I have seen and examined the patient.the plan is to proceed to bring the patient to the emergency room.  I discussed with him the risks and benefits of cardioversion.  The patient is a new-onset atrial flutter.  I told him that the most expedited way of handling this problem will proceed with cardioversion.  This will be done in the emergency room later today.  The patient will also be started on amiodarone.  It is successfully cardioverted he can go home and followup with Dr. Keitha Butte on Monday.

## 2012-02-25 NOTE — Assessment & Plan Note (Signed)
euvolemic today  Normal BiV pacemaker function See Pace Art report No changes today

## 2012-02-25 NOTE — Progress Notes (Signed)
PCP:  Alice Reichert, MD, MD Primary Cardiologist:  Dr [name]  The patient presents today for urgent followup.  He returned to afib last week and became quite dyspneic.  He was seen by Dr Andee Lineman on Friday and cardioverted.  He was placed on amiodarone.  He has done much better since that time.  His SOB and exercise tolerance are improved.  He is tolerating amiodarone without difficulty. He denies CP, SOB, presyncope, or syncope at this time.   Past Medical History  Diagnosis Date  . Hypertension   . Hyperlipemia   . Cardiomyopathy     EF 15-20%... echo... September, 2011  . Edema   . Carotid bruit     Right 11/2010-Doppler: Mild plaque formation (B) w/o hemodynamically significant ICA stenosis, Mild to Moderate (L) ECA Stenosis  . Syncope     Nov.16, 2012, hospitalized [facility], etiology unclear, possibly vertigo component  . Chronic systolic dysfunction of left ventricle   . Deafness     right  . Shortness of breath on exertion   . Chronic renal insufficiency   . CHF (congestive heart failure)   . LBBB (left bundle branch block)   . Persistent atrial fibrillation    Past Surgical History  Procedure Date  . Mastoidectomy ~ 1954    right  . Biventricular pacemaker implantation 11/13/11  . Inguinal hernia repair ~ 1974    Left  . Tonsillectomy 1945    Current Outpatient Prescriptions  Medication Sig Dispense Refill  . amiodarone (PACERONE) 200 MG tablet Take 200 mg by mouth 2 (two) times daily.      [name] aspirin EC 81 MG tablet Take 81 mg by mouth at bedtime.        . carvedilol (COREG) 6.25 MG tablet TAKE ONE TABLET BY MOUTH TWICE DAILY  60 tablet  6  . furosemide (LASIX) 20 MG tablet Take 20 mg by mouth 2 (two) times daily.       . potassium chloride (KLOR-CON) 20 MEQ packet Take 20 mEq by mouth daily.        . ramipril (ALTACE) 5 MG capsule Take 5 mg by mouth daily.       . Rivaroxaban (XARELTO) 15 MG TABS tablet Take 1 tablet (15 mg total) by mouth daily.  30 tablet  11  .  simvastatin (ZOCOR) 40 MG tablet Take 40 mg by mouth at bedtime.        [name] DISCONTD: Rivaroxaban (XARELTO) 15 MG TABS tablet Take 1 tablet (15 mg total) by mouth daily.  10 tablet  0    No Known Allergies  History   Social History  . Marital Status: Married    Spouse Name: N/A    Number of Children: N/A  . Years of Education: N/A   Occupational History  . Retired     Printmaker   Social History Main Topics  . Smoking status: Former Smoker -- 1.5 packs/day for 10 years    Types: Cigarettes    Quit date: 10/29/1949  . Smokeless tobacco: Former Neurosurgeon   Comment: "haven't smoked since ~ 1960's"  . Alcohol Use: No  . Drug Use: No  . Sexually Active: No   Other Topics Concern  . Not on file   Social History Narrative   Lives in [place]    Family History  Problem Relation Age of Onset  . Cancer      Physical Exam: Filed Vitals:   02/25/12 1050  BP: 120/67  Pulse: 64  Resp: 18  Height: 5\' 3"  (1.6 m)  Weight: 169 lb (76.658 kg)    GEN- The patient is well appearing, alert and oriented x 3 today.   Head- normocephalic, atraumatic Eyes-  Sclera clear, conjunctiva pink Ears- hearing intact Oropharynx- clear Neck- supple, JVP 9cm Pacemaker pocket is well healed Lungs- Clear to ausculation bilaterally, normal work of breathing Chest- pacemaker pocket is well healed Heart- RRR GI- soft, NT, ND, + BS Extremities- no clubbing, cyanosis, trace edema   Pacemaker interrogation- reviewed in detail today,  See PACEART report   Assessment and Plan:

## 2012-02-25 NOTE — Assessment & Plan Note (Signed)
Continue amiodarone 200mg  BID x 4 weeks, then 200mg  daily Continue xarelto

## 2012-02-25 NOTE — Assessment & Plan Note (Signed)
Resolved s/p CRT-P

## 2012-02-25 NOTE — Assessment & Plan Note (Signed)
Stable No change required today  Recently K was elevated Repeat bmet today

## 2012-02-26 ENCOUNTER — Other Ambulatory Visit: Payer: Self-pay | Admitting: Cardiology

## 2012-02-27 ENCOUNTER — Telehealth: Payer: Self-pay | Admitting: Internal Medicine

## 2012-02-27 NOTE — Telephone Encounter (Signed)
Recent OV with Dr Johney Frame indicated pt on Lasix 20 bid, and KDur 20 daily. He should remain on this regimen, unless told otherwise.

## 2012-02-27 NOTE — Telephone Encounter (Signed)
RN visit for vitals and orthostatic BPs, this week.

## 2012-02-27 NOTE — Telephone Encounter (Signed)
What is your response about his dizziness?

## 2012-02-27 NOTE — Telephone Encounter (Signed)
Javier Richards has called [name] regarding his potassium states that he called the [name] and Had to leave a message. He is very concerned about why no one is calling him.States that he called the [name]  On 02/26/2012 and no one returned his call.  He also states that he requested To [name] a refill on his Carvedilol.

## 2012-02-27 NOTE — Telephone Encounter (Signed)
New msg Pt wants to talk to you about potassium level. Please call

## 2012-02-27 NOTE — Telephone Encounter (Signed)
Nurse called and informed patient that his carvedilol rx was sent to [facility] on yesterday. Nurse advised patient that his K+ level was normal. Patient wants to know if he needs to restart the potassium and hasn't taken K+ for the past 2 mornings. Patient also states that he woke up this morning experiencing some dizziness. Please advise.

## 2012-02-27 NOTE — Telephone Encounter (Signed)
Patient informed to come to [facility] office tomorrow for nurse visit.

## 2012-02-28 ENCOUNTER — Encounter: Payer: Self-pay | Admitting: *Deleted

## 2012-02-28 ENCOUNTER — Ambulatory Visit (INDEPENDENT_AMBULATORY_CARE_PROVIDER_SITE_OTHER): Payer: Medicare Other | Admitting: *Deleted

## 2012-02-28 VITALS — BP 105/67 | HR 67 | Ht 63.0 in | Wt 168.0 lb

## 2012-02-28 DIAGNOSIS — R42 Dizziness and giddiness: Secondary | ICD-10-CM

## 2012-02-28 NOTE — Progress Notes (Signed)
Thanks

## 2012-02-28 NOTE — Progress Notes (Signed)
Patient presents to office for orthostatic BP due to c/o dizziness. Patient denies having chest pain or shortness of breath. Patient has taken all meds except didn't take his potassium for the past 2 days and say's he will restart it today. No side effects from medications. Patient didn't experience any dizziness during test.

## 2012-03-11 ENCOUNTER — Encounter: Payer: Self-pay | Admitting: Cardiology

## 2012-03-11 DIAGNOSIS — I272 Pulmonary hypertension, unspecified: Secondary | ICD-10-CM | POA: Insufficient documentation

## 2012-03-11 DIAGNOSIS — I350 Nonrheumatic aortic (valve) stenosis: Secondary | ICD-10-CM | POA: Insufficient documentation

## 2012-03-11 DIAGNOSIS — I4892 Unspecified atrial flutter: Secondary | ICD-10-CM | POA: Insufficient documentation

## 2012-03-11 DIAGNOSIS — I340 Nonrheumatic mitral (valve) insufficiency: Secondary | ICD-10-CM | POA: Insufficient documentation

## 2012-03-11 DIAGNOSIS — R9430 Abnormal result of cardiovascular function study, unspecified: Secondary | ICD-10-CM | POA: Insufficient documentation

## 2012-03-17 ENCOUNTER — Encounter: Payer: Self-pay | Admitting: Cardiology

## 2012-03-17 ENCOUNTER — Ambulatory Visit (INDEPENDENT_AMBULATORY_CARE_PROVIDER_SITE_OTHER): Payer: Medicare Other | Admitting: Cardiology

## 2012-03-17 VITALS — BP 105/65 | HR 64 | Ht 63.0 in | Wt 161.0 lb

## 2012-03-17 DIAGNOSIS — Z9229 Personal history of other drug therapy: Secondary | ICD-10-CM | POA: Insufficient documentation

## 2012-03-17 DIAGNOSIS — Z09 Encounter for follow-up examination after completed treatment for conditions other than malignant neoplasm: Secondary | ICD-10-CM

## 2012-03-17 DIAGNOSIS — I509 Heart failure, unspecified: Secondary | ICD-10-CM

## 2012-03-17 DIAGNOSIS — E039 Hypothyroidism, unspecified: Secondary | ICD-10-CM | POA: Insufficient documentation

## 2012-03-17 DIAGNOSIS — I4892 Unspecified atrial flutter: Secondary | ICD-10-CM

## 2012-03-17 DIAGNOSIS — I10 Essential (primary) hypertension: Secondary | ICD-10-CM

## 2012-03-17 NOTE — Progress Notes (Signed)
HPI The patient looks good today. He has cardiomyopathy. He also has pacemaker for syncope. He then developed atrial fibrillation. He was cardioverted. He was placed on amiodarone. He then stabilized. He was seen back in the office on April 29 by Dr. Johney Frame. It was felt that he was stable and could continue amiodarone at 400 mg daily for 4 more weeks. The plan would then be to cut it 200 mg daily. Also he is on xarelto.   No Known Allergies  Current Outpatient Prescriptions  Medication Sig Dispense Refill  . amiodarone (PACERONE) 200 MG tablet Take 200 mg by mouth 2 (two) times daily.      [name] aspirin EC 81 MG tablet Take 81 mg by mouth at bedtime.        . carvedilol (COREG) 6.25 MG tablet TAKE ONE TABLET BY MOUTH TWICE DAILY  60 tablet  6  . furosemide (LASIX) 20 MG tablet Take 20 mg by mouth 2 (two) times daily.       [name] levothyroxine (SYNTHROID, LEVOTHROID) 25 MCG tablet Take 25 mcg by mouth daily.      . potassium chloride (KLOR-CON) 20 MEQ packet Take 20 mEq by mouth daily.        . ramipril (ALTACE) 5 MG capsule Take 5 mg by mouth daily.       . Rivaroxaban (XARELTO) 15 MG TABS tablet Take 1 tablet (15 mg total) by mouth daily.  30 tablet  11  . simvastatin (ZOCOR) 40 MG tablet Take 40 mg by mouth at bedtime.          History   Social History  . Marital Status: Married    Spouse Name: N/A    Number of Children: N/A  . Years of Education: N/A   Occupational History  . Retired     Printmaker   Social History Main Topics  . Smoking status: Former Smoker -- 1.5 packs/day for 10 years    Types: Cigarettes    Quit date: 10/29/1949  . Smokeless tobacco: Former Neurosurgeon   Comment: "haven't smoked since ~ 1960's"  . Alcohol Use: No  . Drug Use: No  . Sexually Active: No   Other Topics Concern  . Not on file   Social History Narrative   Lives in [place]    Family History  Problem Relation Age of Onset  . Cancer      Past Medical History  Diagnosis Date  .  Hypertension   . Hyperlipemia   . Cardiomyopathy   . Edema   . Carotid bruit     Right 11/2010-Doppler: Mild plaque formation (B) w/o hemodynamically significant ICA stenosis, Mild to Moderate (L) ECA Stenosis  . Syncope     Nov.16, 2012, hospitalized [facility], etiology unclear, possibly vertigo component  . Chronic systolic CHF (congestive heart failure)   . Deafness     right  . Chronic renal insufficiency   . LBBB (left bundle branch block)   . Atrial fibrillation   . Ejection fraction < 50%     EF 15-20%, echo, September, 2011  //   EF 20-25%, echo, December, 2012, global hypokinesis, mild AS, moderate MR, PA pressure 52 mm mercury  . Aortic stenosis     Mild, echo, December, 2012  . Mitral regurgitation     Moderate, echo, December, 2012  . Pulmonary hypertension     Moderate, 52 mmHg, echo, December, 2012  . Atrial flutter     April, 2013, cardioverted  .  Pacemaker     CRT-P, Dr. Johney Frame    Past Surgical History  Procedure Date  . Mastoidectomy ~ 1954    right  . Biventricular pacemaker implantation 11/13/11  . Inguinal hernia repair ~ 1974    Left  . Tonsillectomy 1945    ROS Patient denies fever, chills, headache, sweats, rash, change in vision, change in hearing, chest pain, cough, nausea vomiting, urinary symptoms. All other systems are reviewed and are negative.  PHYSICAL EXAM  He looks great today. He asked if he could play golf. I told him he can as long as he takes it nice and easy. If he becomes fatigued he can just skip a hole. There is no jugular venous distention. Lungs are clear. Respiratory effort is nonlabored. Cardiac exam reveals S1 and S2. There no clicks or significant murmurs. The abdomen is soft. There is no peripheral edema.  Filed Vitals:   03/17/12 1429  BP: 105/65  Pulse: 64  Height: 5\' 3"  (1.6 m)  Weight: 161 lb (73.029 kg)    ASSESSMENT & PLAN

## 2012-03-17 NOTE — Assessment & Plan Note (Signed)
But pressures control. No change in therapy.

## 2012-03-17 NOTE — Assessment & Plan Note (Signed)
This is a new diagnosis in 2013. It is probably related amiodarone.

## 2012-03-17 NOTE — Patient Instructions (Signed)
Your physician recommends that you schedule a follow-up appointment in: 3 months. You will be given an appointment today at the check out desk.  Your physician has recommended you make the following change in your medication: Continue your amiodarone 200 mg twice daily for one more week, then on May 28th 2013, decrease to 200 mg daily.

## 2012-03-17 NOTE — Assessment & Plan Note (Signed)
His atrial arrhythmia was converted. He is holding sinus rhythm.

## 2012-03-17 NOTE — Assessment & Plan Note (Signed)
CHF is controlled. No change in therapy.

## 2012-03-17 NOTE — Assessment & Plan Note (Signed)
After one additional week his dose will be decreased from 400 mg daily to 200 mg daily. We'll see him back in 3 months.

## 2012-03-19 NOTE — Telephone Encounter (Signed)
Javier Richards, [name]  Could you please call this patient and tell him to CALL ME so I can discuss findings on the CXR ASAP.

## 2012-03-20 ENCOUNTER — Other Ambulatory Visit: Payer: Self-pay | Admitting: Cardiology

## 2012-03-20 MED ORDER — AMIODARONE HCL 200 MG PO TABS: 200.0000 mg | ORAL_TABLET | Freq: Two times a day (BID) | ORAL | Status: DC

## 2012-03-21 ENCOUNTER — Other Ambulatory Visit: Payer: Self-pay | Admitting: Cardiology

## 2012-03-21 ENCOUNTER — Telehealth: Payer: Self-pay | Admitting: Cardiology

## 2012-03-21 DIAGNOSIS — I509 Heart failure, unspecified: Secondary | ICD-10-CM

## 2012-03-21 DIAGNOSIS — R0602 Shortness of breath: Secondary | ICD-10-CM

## 2012-03-21 DIAGNOSIS — I10 Essential (primary) hypertension: Secondary | ICD-10-CM

## 2012-03-21 DIAGNOSIS — I4892 Unspecified atrial flutter: Secondary | ICD-10-CM

## 2012-03-21 NOTE — Telephone Encounter (Signed)
Called Javier Richards today about the concern of his Chest Xray. States that noone has discussed the test results. In the recommendations it states he needs a CT Chest. Will check with Gene today on this issue. Also, Javier Richards Needs a call back in regards to his Amiodarone 200 mg that he is taking. He is concerned about how much longer He is to take this medication.

## 2012-03-21 NOTE — Telephone Encounter (Signed)
Spoke with patient and informed him that he is to continue amiodarone unless a provider tell him to stop this. Patient verbalized understanding.

## 2012-03-27 ENCOUNTER — Encounter: Payer: Self-pay | Admitting: *Deleted

## 2012-03-27 NOTE — Progress Notes (Signed)
Wife walked into office requesting that patient be informed when results are available of CT Chest since no one ever informed him of chest xray at appropriate time and also wanted know why he was having this done. Nurse informed her that because chest xray showed an area that needed additional images, the CT chest was best test to get better picture of the area. Nurse informed her that once results reviewed and resulted by provider, that we would contact patient. Wife verbalized understanding of plan.

## 2012-03-31 ENCOUNTER — Telehealth: Payer: Self-pay | Admitting: Cardiology

## 2012-03-31 NOTE — Telephone Encounter (Signed)
PATIENT WANTING RESULTS FORM CT SCAN

## 2012-04-02 ENCOUNTER — Telehealth: Payer: Self-pay | Admitting: *Deleted

## 2012-04-02 NOTE — Telephone Encounter (Signed)
Message copied by Eustace Moore on Wed Apr 02, 2012  4:33 PM ------      Message from: Murriel Hopper      Created: Wed Apr 02, 2012  4:11 PM                   ----- Message -----         From: June Leap, MD         Sent: 03/28/2012   3:30 PM           To: Lesle Chris, LPN            No pulmonary mass no cancer mild interstitial lung disease ntd

## 2012-04-02 NOTE — Telephone Encounter (Signed)
Patient informed. 

## 2012-04-25 ENCOUNTER — Ambulatory Visit: Payer: Medicare Other | Admitting: Cardiology

## 2012-05-11 ENCOUNTER — Emergency Department (HOSPITAL_COMMUNITY)
Admission: EM | Admit: 2012-05-11 | Discharge: 2012-05-11 | Disposition: A | Discharge status: Home / Self Care | Payer: Medicare Other | Attending: Emergency Medicine | Admitting: Emergency Medicine

## 2012-05-11 ENCOUNTER — Encounter (HOSPITAL_COMMUNITY): Payer: Self-pay

## 2012-05-11 DIAGNOSIS — Z87891 Personal history of nicotine dependence: Secondary | ICD-10-CM | POA: Insufficient documentation

## 2012-05-11 DIAGNOSIS — E039 Hypothyroidism, unspecified: Secondary | ICD-10-CM | POA: Insufficient documentation

## 2012-05-11 DIAGNOSIS — I509 Heart failure, unspecified: Secondary | ICD-10-CM | POA: Insufficient documentation

## 2012-05-11 DIAGNOSIS — E785 Hyperlipidemia, unspecified: Secondary | ICD-10-CM | POA: Insufficient documentation

## 2012-05-11 DIAGNOSIS — I5022 Chronic systolic (congestive) heart failure: Secondary | ICD-10-CM | POA: Insufficient documentation

## 2012-05-11 DIAGNOSIS — R55 Syncope and collapse: Secondary | ICD-10-CM

## 2012-05-11 DIAGNOSIS — I10 Essential (primary) hypertension: Secondary | ICD-10-CM | POA: Insufficient documentation

## 2012-05-11 DIAGNOSIS — I428 Other cardiomyopathies: Secondary | ICD-10-CM | POA: Insufficient documentation

## 2012-05-11 LAB — CBC WITH DIFFERENTIAL/PLATELET
Basophils Absolute: 0 10*3/uL (ref 0.0–0.1)
Basophils Relative: 0 % (ref 0–1)
Eosinophils Absolute: 0.2 K/uL (ref 0.0–0.7)
Eosinophils Relative: 2 % (ref 0–5)
HCT: 39.5 % (ref 39.0–52.0)
Hemoglobin: 13.2 g/dL (ref 13.0–17.0)
Lymphocytes Relative: 8 % — ABNORMAL LOW (ref 12–46)
Lymphs Abs: 0.7 K/uL (ref 0.7–4.0)
MCH: 32.1 pg (ref 26.0–34.0)
MCHC: 33.4 g/dL (ref 30.0–36.0)
MCV: 96.1 fL (ref 78.0–100.0)
Monocytes Absolute: 0.6 10*3/uL (ref 0.1–1.0)
Monocytes Relative: 6 % (ref 3–12)
Neutro Abs: 8 K/uL — ABNORMAL HIGH (ref 1.7–7.7)
Neutrophils Relative %: 84 % — ABNORMAL HIGH (ref 43–77)
Platelets: 191 K/uL (ref 150–400)
RBC: 4.11 MIL/uL — ABNORMAL LOW (ref 4.22–5.81)
RDW: 15.3 % (ref 11.5–15.5)
WBC: 9.5 K/uL (ref 4.0–10.5)

## 2012-05-11 LAB — BASIC METABOLIC PANEL WITH GFR
Chloride: 96 meq/L (ref 96–112)
GFR calc non Af Amer: 37 mL/min — ABNORMAL LOW (ref >90)
Glucose, Bld: 97 mg/dL (ref 70–99)
Potassium: 4.3 meq/L (ref 3.5–5.1)
Sodium: 133 meq/L — ABNORMAL LOW (ref 135–145)

## 2012-05-11 LAB — BASIC METABOLIC PANEL
BUN: 43 mg/dL — ABNORMAL HIGH (ref 6–23)
CO2: 26 mEq/L (ref 19–32)
Calcium: 9.5 mg/dL (ref 8.4–10.5)
Creatinine, Ser: 1.6 mg/dL — ABNORMAL HIGH (ref 0.50–1.35)
GFR calc Af Amer: 42 mL/min — ABNORMAL LOW (ref >90)

## 2012-05-11 LAB — TROPONIN I: Troponin I: <0.3 ng/mL (ref <0.30)

## 2012-05-11 MED ORDER — SODIUM CHLORIDE 0.9 % IV BOLUS (SEPSIS)
500.0000 mL | Freq: Once | INTRAVENOUS | Status: AC
  Administered 2012-05-11: 500 mL via INTRAVENOUS

## 2012-05-11 MED ORDER — SODIUM CHLORIDE 0.9 % IV BOLUS (SEPSIS)
500.0000 mL | Freq: Once | INTRAVENOUS | Status: AC
  Administered 2012-05-11: 1000 mL via INTRAVENOUS

## 2012-05-11 NOTE — ED Notes (Signed)
[place] EMS pt at church had a syncopal episode while sitting down at church today, pt feels he got hot and passed out. Per bystanders brief period of LOC, did not hit his head. Pt denies chest pain, sob, N/V, weakness, or dizziness. No orthostatic changes noted. First bp 92/50, 114/74 en route, pt received 20 cc NS. Lung field clear, 12 lead SR w/RBBB, pt has a pacemaker, 20 g LAC. PCP Dr. Megan Mans and cardiologist Dr. Heywood Iles

## 2012-05-11 NOTE — ED Provider Notes (Signed)
History     CSN: [id number]  Arrival date & time 05/11/12  1205   First MD Initiated Contact with Patient 05/11/12 1259      Chief Complaint  Patient presents with  . Near Syncope    (Consider location/radiation/quality/duration/timing/severity/associated sxs/prior treatment) HPI Comments: Pt reports was in church, was sitting at the time, developed sense of warmness over chest and into face.  Got sweaty, shirt wet, slight nausea, no dizziness, no SOB, CP, back pain, abd pain.  No palpitations.  He has defib and pacer put in by Dr. Johney Frame due to a "fluttery" heart.  He used to pass out and be confused.  Since pacer put in, he was put on pacerone recently,but no change in BP meds.  No recent cold, cough, URI symptoms.  No black stools, no recent vomiting or diarrhea.  Pt feels back to normal now.  Spouse reports he has complained of intermittent dizziness that he agrees with, states it is transient light headed on some mornings that resolves on its own.    The history is provided by the patient and a relative.    Past Medical History  Diagnosis Date  . Hypertension   . Hyperlipemia   . Cardiomyopathy   . Edema   . Carotid bruit     Right 11/2010-Doppler: Mild plaque formation (B) w/o hemodynamically significant ICA stenosis, Mild to Moderate (L) ECA Stenosis  . Syncope     Nov.16, 2012, hospitalized [facility], etiology unclear, possibly vertigo component  . Chronic systolic CHF (congestive heart failure)   . Deafness     right  . Chronic renal insufficiency   . LBBB (left bundle branch block)   . Atrial fibrillation   . Ejection fraction < 50%     EF 15-20%, echo, September, 2011  //   EF 20-25%, echo, December, 2012, global hypokinesis, mild AS, moderate MR, PA pressure 52 mm mercury  . Aortic stenosis     Mild, echo, December, 2012  . Mitral regurgitation     Moderate, echo, December, 2012  . Pulmonary hypertension     Moderate, 52 mmHg, echo, December, 2012  . Atrial  flutter     April, 2013, cardioverted  . Pacemaker     CRT-P, Dr. Johney Frame  . Hypothyroidism     New diagnosis 2013 on amiodarone.  . H/O amiodarone therapy     For atrial arrhythmias, 2013    Past Surgical History  Procedure Date  . Mastoidectomy ~ 1954    right  . Biventricular pacemaker implantation 11/13/11  . Inguinal hernia repair ~ 1974    Left  . Tonsillectomy 1945    Family History  Problem Relation Age of Onset  . Cancer      History  Substance Use Topics  . Smoking status: Former Smoker -- 1.5 packs/day for 10 years    Types: Cigarettes    Quit date: 10/29/1949  . Smokeless tobacco: Former Neurosurgeon   Comment: "haven't smoked since ~ 1960's"  . Alcohol Use: No      Review of Systems  Constitutional: Positive for diaphoresis. Negative for fever and chills.  Respiratory: Negative for chest tightness.   Cardiovascular: Negative for chest pain and palpitations.  Gastrointestinal: Negative for nausea, vomiting and abdominal pain.  Musculoskeletal: Negative for back pain.  Neurological: Positive for syncope and light-headedness.  All other systems reviewed and are negative.    Allergies  Review of patient's allergies indicates no known allergies.  Home Medications  Current Outpatient Rx  Name Route Sig Dispense Refill  . AMIODARONE HCL 200 MG PO TABS Oral Take 200 mg by mouth daily.    [name] CARVEDILOL 6.25 MG PO TABS Oral Take 6.25 mg by mouth 2 (two) times daily with a meal.    . FUROSEMIDE 20 MG PO TABS Oral Take 20 mg by mouth 2 (two) times daily.     [name] LEVOTHYROXINE SODIUM 25 MCG PO TABS Oral Take 25 mcg by mouth daily.    [name] POTASSIUM CHLORIDE 20 MEQ PO PACK Oral Take 20 mEq by mouth daily.      [name] RAMIPRIL 5 MG PO CAPS Oral Take 5 mg by mouth daily.     [name] RIVAROXABAN 15 MG PO TABS Oral Take 15 mg by mouth daily at 12 noon.    [name] SIMVASTATIN 40 MG PO TABS Oral Take 40 mg by mouth at bedtime.        BP 135/73  Pulse 66  Temp 97.7 F (36.5 C) (Oral)   Resp 20  SpO2 100%  Physical Exam  Nursing note and vitals reviewed. Constitutional: He is oriented to person, place, and time. He appears well-developed and well-nourished. No distress.  HENT:  Head: Normocephalic and atraumatic.  Eyes: Pupils are equal, round, and reactive to light.  Neck: Normal range of motion. Neck supple.  Cardiovascular: Normal rate and regular rhythm.   No murmur heard. Pulmonary/Chest: Effort normal. No respiratory distress. He has no wheezes. He has no rales.  Abdominal: Soft. He exhibits no distension and no mass. There is no tenderness. There is no rebound.  Musculoskeletal: He exhibits no edema and no tenderness.  Neurological: He is alert and oriented to person, place, and time. He has normal strength. No sensory deficit. He exhibits normal muscle tone. Coordination normal. GCS eye subscore is 4. GCS verbal subscore is 5. GCS motor subscore is 6.       No arm drift  Skin: Skin is warm and dry. No rash noted. He is not diaphoretic. No pallor.    ED Course  Procedures (including critical care time)  Labs Reviewed  CBC WITH DIFFERENTIAL - Abnormal; Notable for the following:    RBC 4.11 (*)     Neutrophils Relative 84 (*)     Neutro Abs 8.0 (*)     Lymphocytes Relative 8 (*)     All other components within normal limits  BASIC METABOLIC PANEL - Abnormal; Notable for the following:    Sodium 133 (*)     BUN 43 (*)     Creatinine, Ser 1.60 (*)     GFR calc non Af Amer 37 (*)     GFR calc Af Amer 42 (*)     All other components within normal limits  TROPONIN I   No results found.   1. Syncope     RA O2 sat is 98% which I interpret to be normal.  ECG at time 12:07 shows paced rhtyhm at rate 65, AV sequential.  No sig change from ECG on 11/14/11.  No overt ST changes.    2:20 PM Mild orthostatis with sitting up, but improved with standing.    2:54 PM Per [employer] rep, no ventricular arrythmia, pacer is working properly.  If pt is feeling  improved, will d.c home.  BUN and CR are slightly up suggesting a little dehydration.    4:23 PM Pt ambulated well, tolerating PO's, pt would to go home.  He is agreeable and  can follow up with his cardiologist as outpt, knows to return if any concerning symptoms develop.    MDM  Appears back to baseline.  BP improved.  No CP.  Will interrogate pacer/def, monitor here, check basic labs.  No CVA signs or symptoms.          Javier Richards. Laresha Bacorn, MD 05/11/12 1624

## 2012-05-11 NOTE — Discharge Instructions (Signed)
 Syncope You have had a fainting (syncopal) spell. A fainting episode is a sudden, short-lived loss of consciousness. It results in complete recovery. It occurs because there has been a temporary shortage of oxygen and/or sugar (glucose) to the brain. CAUSES   Blood pressure pills and other medications that may lower blood pressure below normal. Sudden changes in posture (sudden standing).   Over-medication. Take your medications as directed.   Standing too long. This can cause blood to pool in the legs.   Seizure disorders.   Low blood sugar (hypoglycemia) of diabetes. This more commonly causes coma.   Bearing down to go to the bathroom. This can cause your blood pressure to rise suddenly. Your body compensates by making the blood pressure too low when you stop bearing down.   Hardening of the arteries where the brain temporarily does not receive enough blood.   Irregular heart beat and circulatory problems.   Fear, emotional distress, injury, sight of blood, or illness.  Your caregiver will send you home if the syncope was from non-worrisome causes (benign). Depending on your age and health, you may stay to be monitored and observed. If you return home, have someone stay with you if your caregiver feels that is desirable. It is very important to keep all follow-up referrals and appointments in order to properly manage this condition. This is a serious problem which can lead to serious illness and death if not carefully managed.  WARNING: Do not drive or operate machinery until your caregiver feels that it is safe for you to do so. SEEK IMMEDIATE MEDICAL CARE IF:   You have another fainting episode or faint while lying or sitting down. DO NOT DRIVE YOURSELF. Call 911 if no other help is available.   You have chest pain, are feeling sick to your stomach (nausea), vomiting or abdominal pain.   You have an irregular heartbeat or one that is very fast (pulse over 120 beats per minute).    You have a loss of feeling in some part of your body or lose movement in your arms or legs.   You have difficulty with speech, confusion, severe weakness, or visual problems.   You become sweaty and/or feel light headed.  Make sure you are rechecked as instructed. Document Released: 10/15/2005 Document Revised: 10/04/2011 Document Reviewed: 06/05/2007 [facility] Patient Information 2012 [place], [place].

## 2012-05-11 NOTE — ED Notes (Signed)
Family at bedside. 

## 2012-05-11 NOTE — ED Notes (Signed)
Pt reports feeling warm while in Sunday school class this am, states he felt the need to remove his jacket but did not, pt reports he became diaphoretic and felt light headed. Pt reports the next thing he knows he woke up to several people standing over him with cold wash cloths on his head. Pt reports he was sitting down when the incident happened, reports he was still sitting in the chair. Per spouse his church members reports pt was "out for a good while." Pt reports a hx of the same prior to having a pacemaker inserted. Pt denies chest pain, N/V, cough, congestion, and/or back/abd pain.

## 2012-05-22 ENCOUNTER — Emergency Department (HOSPITAL_COMMUNITY)
Admission: EM | Admit: 2012-05-22 | Discharge: 2012-05-29 | Disposition: E | Payer: Medicare Other | Attending: Emergency Medicine | Admitting: Emergency Medicine

## 2012-05-22 DIAGNOSIS — I4891 Unspecified atrial fibrillation: Secondary | ICD-10-CM | POA: Insufficient documentation

## 2012-05-22 DIAGNOSIS — E785 Hyperlipidemia, unspecified: Secondary | ICD-10-CM | POA: Insufficient documentation

## 2012-05-22 DIAGNOSIS — I469 Cardiac arrest, cause unspecified: Secondary | ICD-10-CM | POA: Insufficient documentation

## 2012-05-22 DIAGNOSIS — Z87891 Personal history of nicotine dependence: Secondary | ICD-10-CM | POA: Insufficient documentation

## 2012-05-22 DIAGNOSIS — E039 Hypothyroidism, unspecified: Secondary | ICD-10-CM | POA: Insufficient documentation

## 2012-05-22 DIAGNOSIS — I10 Essential (primary) hypertension: Secondary | ICD-10-CM | POA: Insufficient documentation

## 2012-05-22 DIAGNOSIS — Z950 Presence of cardiac pacemaker: Secondary | ICD-10-CM | POA: Insufficient documentation

## 2012-05-22 MED ORDER — ATROPINE SULFATE 1 MG/ML IJ SOLN
INTRAMUSCULAR | Status: AC | PRN
  Administered 2012-05-22: 1 mg via INTRAVENOUS

## 2012-05-22 MED ORDER — EPINEPHRINE HCL 0.1 MG/ML IJ SOLN
INTRAMUSCULAR | Status: AC | PRN
  Administered 2012-05-22: 1 mg via INTRAVENOUS

## 2012-05-22 MED FILL — Medication: Qty: 1 | Status: AC

## 2012-05-23 NOTE — ED Provider Notes (Signed)
History     CSN: [id number]  Arrival date & time 05/04/2012  1022   First MD Initiated Contact with Patient 05/21/2012 1028      Chief Complaint  Patient presents with  . Cardiac Arrest    (Consider location/radiation/quality/duration/timing/severity/associated sxs/prior treatment) HPI.... level V caveat for CPR.  Patient was playing golf when he keeled over.  This plane partner's report no prodromal complaints.  CPR was initiated by his friend.  The fire department and EMS responded quickly.  EMS reports a rhythm of asystole and PEA.  Multiple doses of epinephrine and atropine given. CPR continued throughout a 30 minute course.  Emergency department patient had a paced rhythm. He was intubated. No spontaneous respirations. Pupils were fixed and dilated. Minimal intervention in ED   Past Medical History  Diagnosis Date  . Hypertension   . Hyperlipemia   . Cardiomyopathy   . Edema   . Carotid bruit     Right 11/2010-Doppler: Mild plaque formation (B) w/o hemodynamically significant ICA stenosis, Mild to Moderate (L) ECA Stenosis  . Syncope     Nov.16, 2012, hospitalized [facility], etiology unclear, possibly vertigo component  . Chronic systolic CHF (congestive heart failure)   . Deafness     right  . Chronic renal insufficiency   . LBBB (left bundle branch block)   . Atrial fibrillation   . Ejection fraction < 50%     EF 15-20%, echo, September, 2011  //   EF 20-25%, echo, December, 2012, global hypokinesis, mild AS, moderate MR, PA pressure 52 mm mercury  . Aortic stenosis     Mild, echo, December, 2012  . Mitral regurgitation     Moderate, echo, December, 2012  . Pulmonary hypertension     Moderate, 52 mmHg, echo, December, 2012  . Atrial flutter     April, 2013, cardioverted  . Pacemaker     CRT-P, Dr. Johney Frame  . Hypothyroidism     New diagnosis 2013 on amiodarone.  . H/O amiodarone therapy     For atrial arrhythmias, 2013    Past Surgical History  Procedure Date  .  Mastoidectomy ~ 1954    right  . Biventricular pacemaker implantation 11/13/11  . Inguinal hernia repair ~ 1974    Left  . Tonsillectomy 1945    Family History  Problem Relation Age of Onset  . Cancer      History  Substance Use Topics  . Smoking status: Former Smoker -- 1.5 packs/day for 10 years    Types: Cigarettes    Quit date: 10/29/1949  . Smokeless tobacco: Former Neurosurgeon   Comment: "haven't smoked since ~ 1960's"  . Alcohol Use: No      Review of Systems  Unable to perform ROS: Other    Allergies  Review of patient's allergies indicates no known allergies.  Home Medications   Current Outpatient Rx  Name Route Sig Dispense Refill  . AMIODARONE HCL 200 MG PO TABS Oral Take 200 mg by mouth daily.    [name] CARVEDILOL 6.25 MG PO TABS Oral Take 6.25 mg by mouth 2 (two) times daily with a meal.    . FUROSEMIDE 20 MG PO TABS Oral Take 20 mg by mouth 2 (two) times daily.     [name] LEVOTHYROXINE SODIUM 25 MCG PO TABS Oral Take 25 mcg by mouth daily.    [name] POTASSIUM CHLORIDE 20 MEQ PO PACK Oral Take 20 mEq by mouth daily.      [name] RAMIPRIL 5 MG  PO CAPS Oral Take 5 mg by mouth daily.     [name] RIVAROXABAN 15 MG PO TABS Oral Take 15 mg by mouth daily at 12 noon.    [name] SIMVASTATIN 40 MG PO TABS Oral Take 40 mg by mouth at bedtime.        Pulse 0  Wt 161 lb (73.029 kg)  SpO2 97%  Physical Exam  Constitutional:       Unresponsive, intubated  HENT:  Head: Normocephalic.  Eyes:       Pupils fixed and dilated  Neck: Neck supple.  Cardiovascular:       No palpable pulses  Pulmonary/Chest:       No spontaneous respiratory effort  Abdominal: Soft. Mass: unresponsive, intubated.  Musculoskeletal:       Unable  Neurological:       Unable  Skin: Skin is dry.  Psychiatric:       Unable    ED Course  Procedures (including critical care time)  Labs Reviewed - No data to display No results found.   No diagnosis found.  CRITICAL CARE Performed by: [name]   Total  critical care time: 30  Critical care time was exclusive of separately billable procedures and treating other patients.  Critical care was necessary to treat or prevent imminent or life-threatening deterioration.  Critical care was time spent personally by me on the following activities: development of treatment plan with patient and/or surrogate as well as nursing, discussions with consultants, evaluation of patient's response to treatment, examination of patient, obtaining history from patient or surrogate, ordering and performing treatments and interventions, ordering and review of laboratory studies, ordering and review of radiographic studies, pulse oximetry and re-evaluation of patient's condition.  MDM  I opted to do minimal interventions in the emergency department.  I suspect patient had a myocardial infarction.  I discussed his death with his wife and his primary care physician.  Past medical history reviewed in detail. Patient has significant cardiac history including a pacemaker insertion in the past year.        [name], MD 05/17/2012 [phone]

## 2012-05-29 NOTE — Code Documentation (Signed)
Patient time of death occurred at 10:24am.

## 2012-05-29 NOTE — ED Notes (Signed)
Pt witnessed arrest on golf cart. Immediately bystander CPR was started. On arrival to ED pt in PEA with [facility] airway in place. CPR ongoing.Dr. Adriana Simas at bedside.

## 2012-05-29 NOTE — ED Notes (Signed)
Body taken to morgue.

## 2012-05-29 NOTE — Code Documentation (Signed)
7epi 2 atropine per ems

## 2012-05-29 NOTE — ED Notes (Signed)
Family in and notified of status by Dr. Adriana Simas. In to see deceased with family, pastor and chaplain.

## 2012-05-29 NOTE — ED Notes (Signed)
I called and spoke with patient's wife to notify her that he was here and she states that "the men came and told me and I am on the way".

## 2012-05-29 NOTE — ED Notes (Signed)
No family have arrived at this time. Referral done for CDS at 1045 and patient is not eligible for donation due to age.

## 2012-05-29 DEATH — deceased

## 2012-06-02 ENCOUNTER — Ambulatory Visit: Payer: Medicare Other | Admitting: Physician Assistant

## 2012-06-18 ENCOUNTER — Ambulatory Visit: Payer: Medicare Other | Admitting: Cardiology

## 2013-03-17 IMAGING — CT CT ABD-PELV W/O CM
2 of 4 series · 16 of 46 positions shown, 18 images · non-contrast
Comparison: None.

CLINICAL DATA: Nausea and vomiting.  Weakness and dizziness.

CT ABDOMEN AND PELVIS WITHOUT CONTRAST
TECHNIQUE: Multidetector CT imaging of the abdomen and pelvis was
performed following the standard protocol without intravenous
contrast.

[Series 2: abdomen/pelvis w/o contrast · axial · non-contrast · 0.80mm/px · z∈[-454,-4]mm · 13 of 100 slices shown, 15 images]
[im 5/100  soft-tissue]
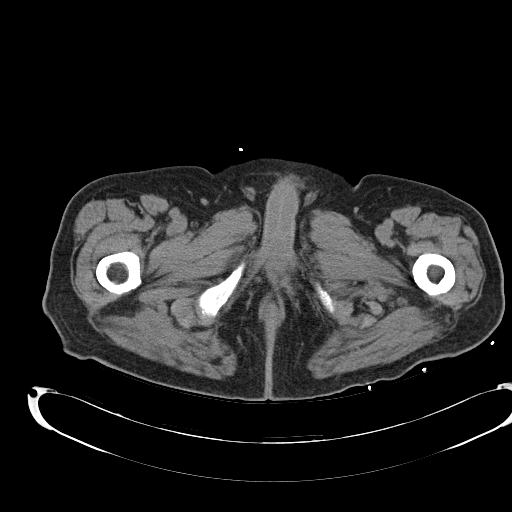
[im 5/100  bone]
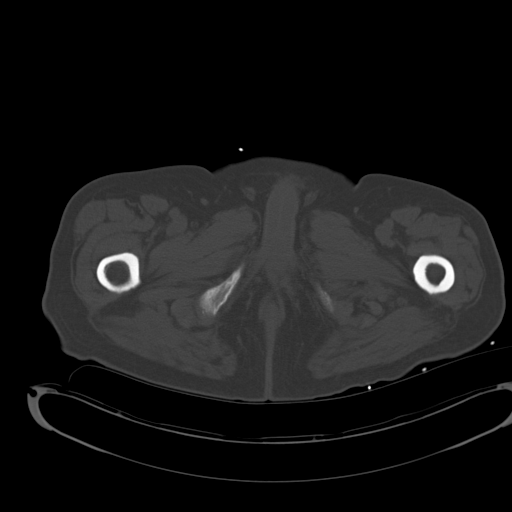
[im 13/100  soft-tissue]
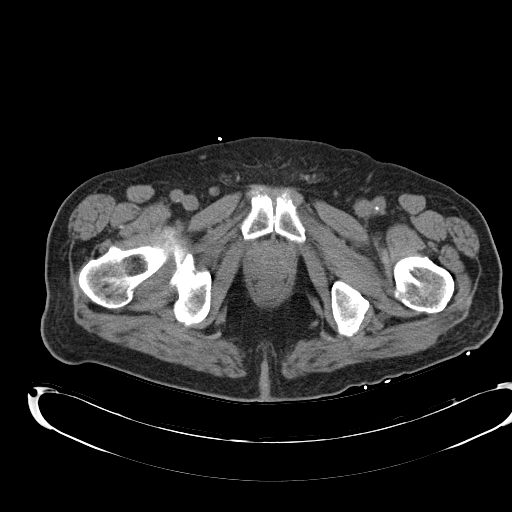
[im 22/100  soft-tissue]
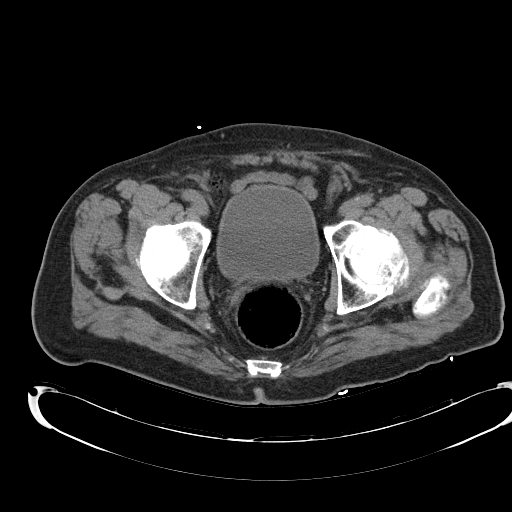
[im 26/100  soft-tissue]
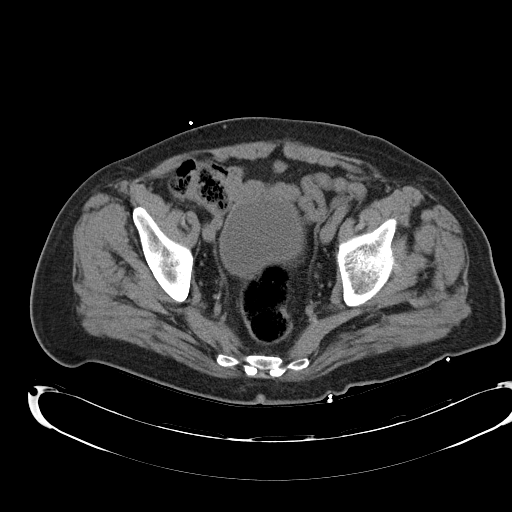
[im 35/100  soft-tissue]
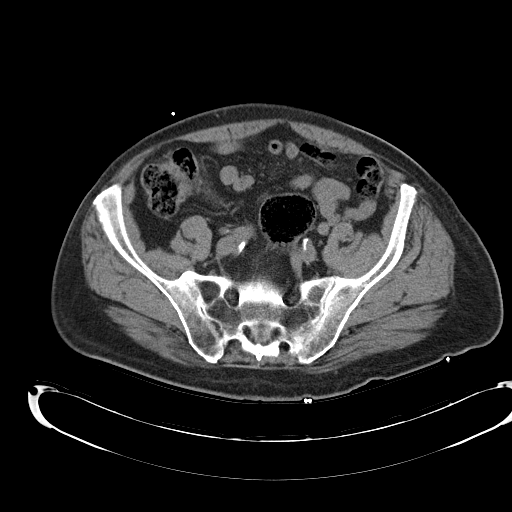
[im 44/100  soft-tissue]
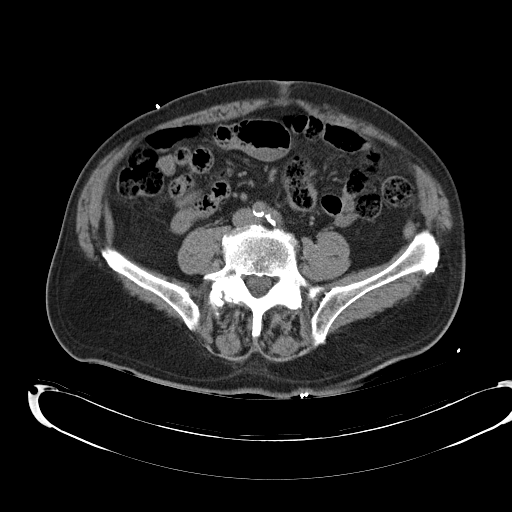
[im 52/100  soft-tissue]
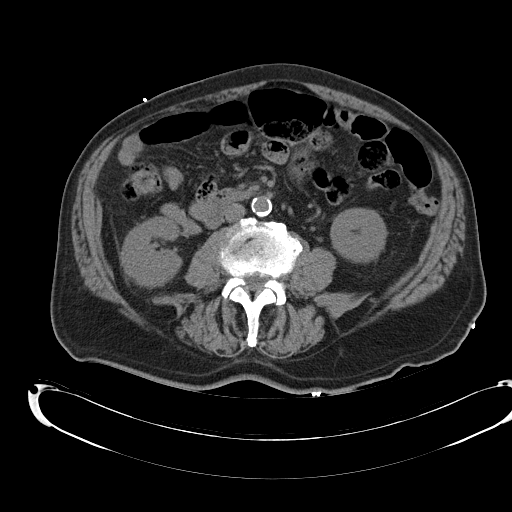
[im 56/100  soft-tissue]
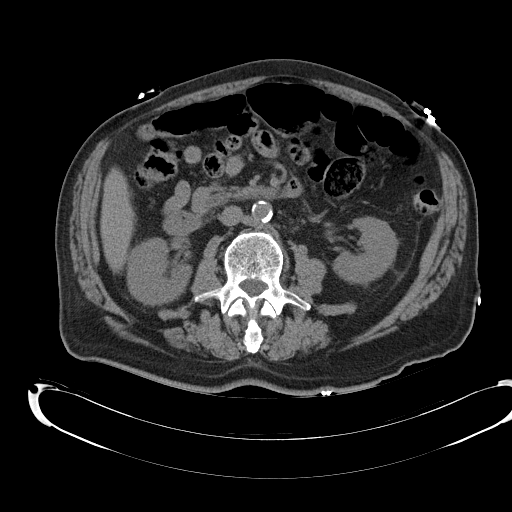
[im 65/100  soft-tissue]
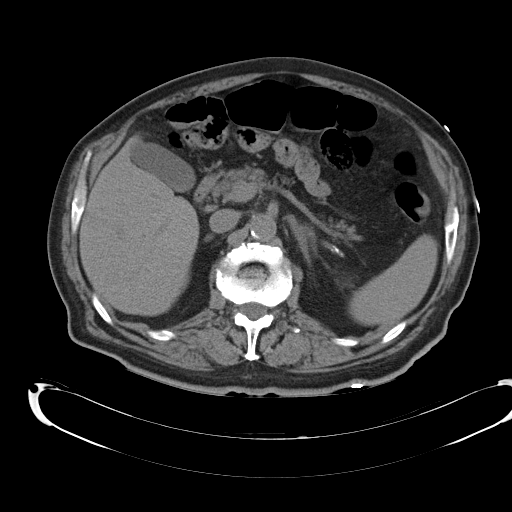
[im 65/100  bone]
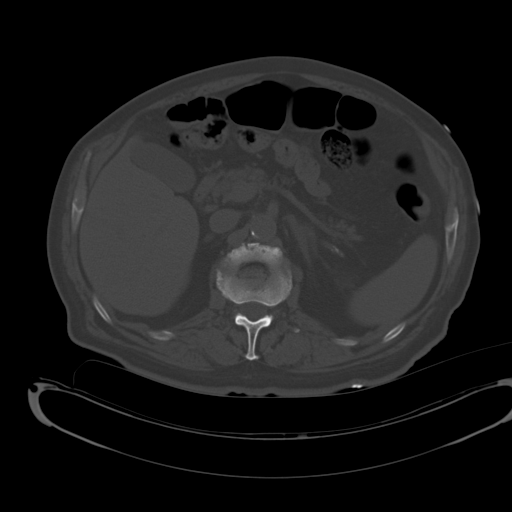
[im 74/100  soft-tissue]
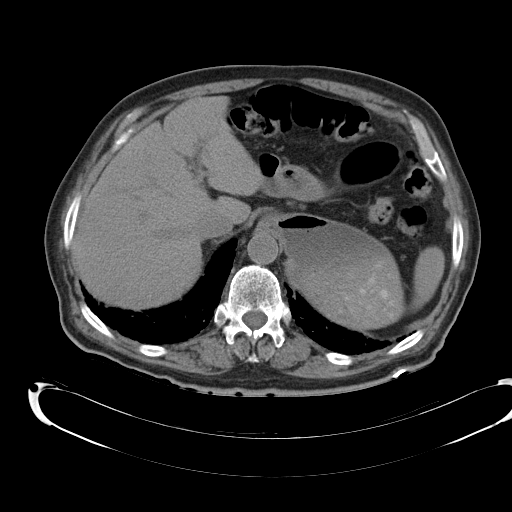
[im 78/100  soft-tissue]
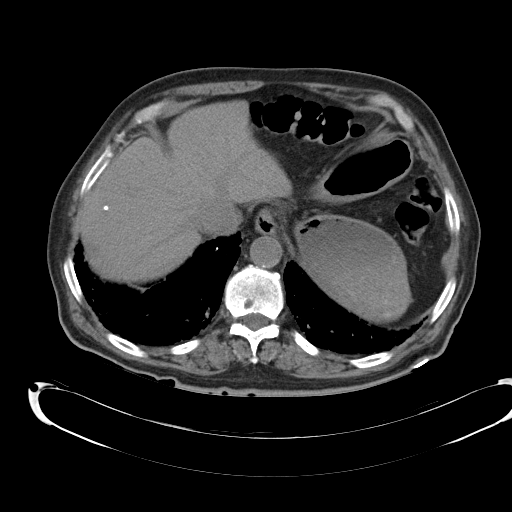
[im 87/100  soft-tissue]
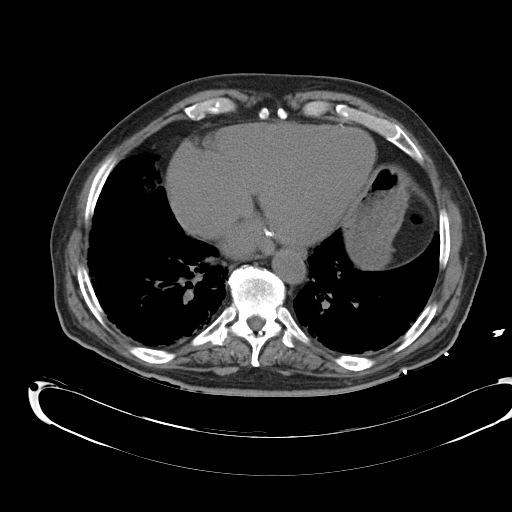
[im 95/100  soft-tissue]
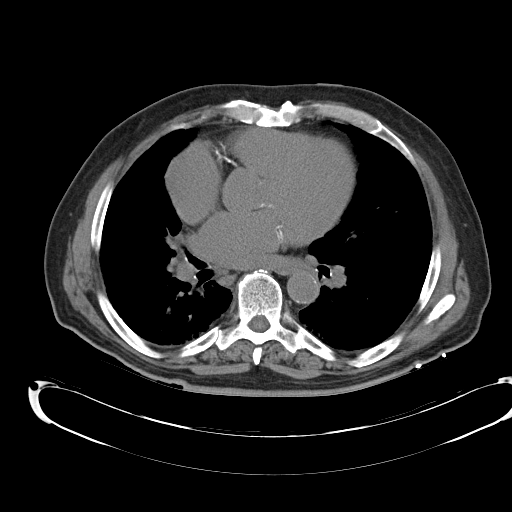

[Series 5: mpr cor (id) · coronal · 0.73mm/px · 3 of 86 slices shown]
[im 29/86  soft-tissue]
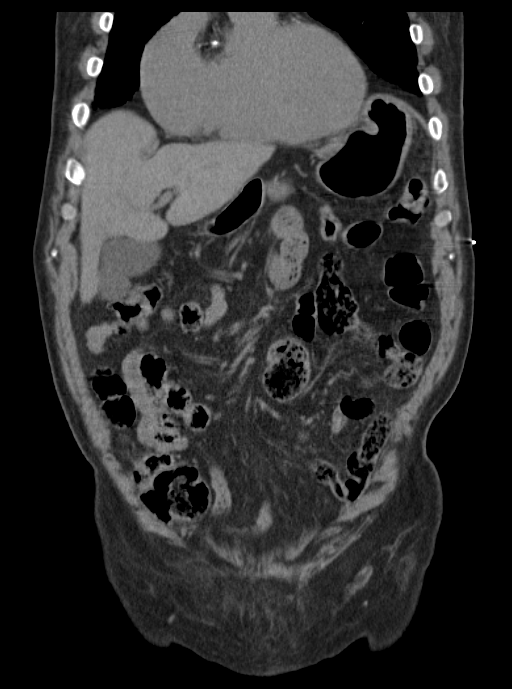
[im 38/86  soft-tissue]
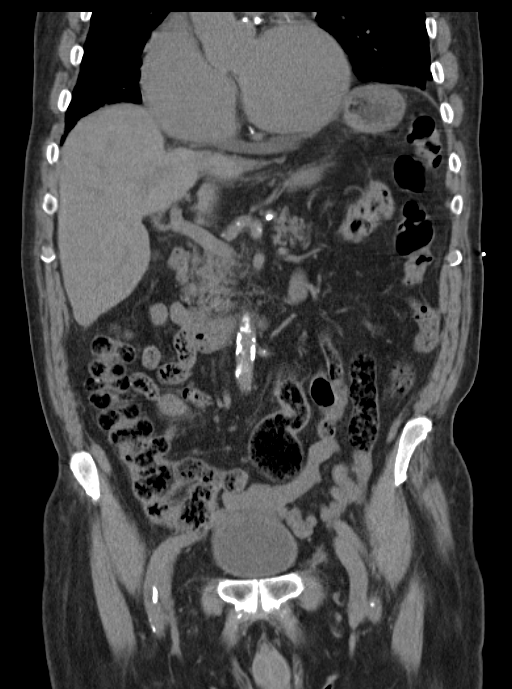
[im 48/86  soft-tissue]
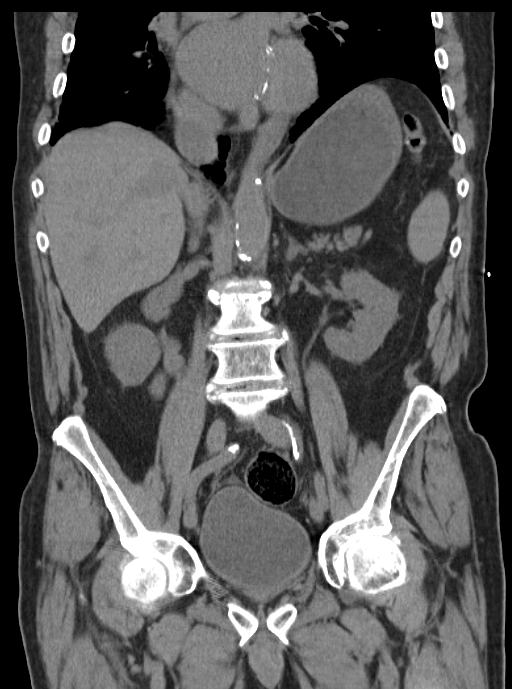

[16 of 46 positions shown; findings below may reference images not displayed]

FINDINGS: Mild to moderate cardiomegaly noted with diffuse coronary
artery calcification.

Noncontrast images of the abdominal parenchymal organs are normal
in appearance.  Gallbladder is unremarkable.  No evidence of
hydronephrosis.  No evidence of urolithiasis.

No soft tissue masses or lymphadenopathy identified within the
abdomen or pelvis.  No evidence of inflammatory process or abnormal
fluid collections.  Unopacified bowel is unremarkable in
appearance.
IMPRESSION: 1.  No acute findings.
2.  Mild to moderate cardiomegaly and diffuse coronary artery
calcification noted.

## 2013-05-01 IMAGING — CT CT HEAD W/O CM
1 series · 16 of 30 positions shown, 20 images · non-contrast
Comparison: 09/14/2011

CLINICAL DATA: Syncope.

CT HEAD WITHOUT CONTRAST
TECHNIQUE: Contiguous axial images were obtained from the base of
the skull through the vertex without contrast.

[Series 2: headtrauma 4.8 h37s · axial · 0.43mm/px · z∈[+100,+230]mm · 16 of 30 slices shown, 20 images]
[im 2/30  brain]
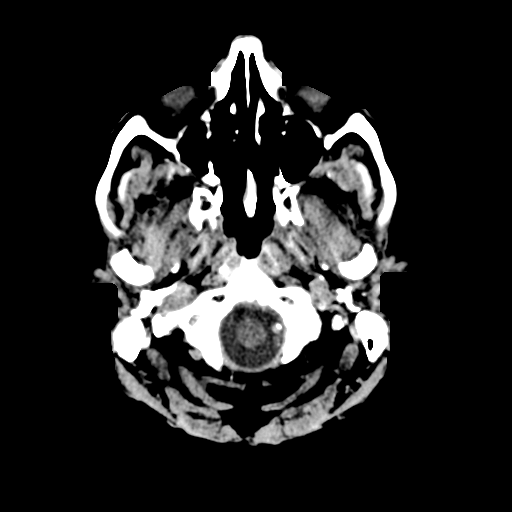
[im 2/30  bone]
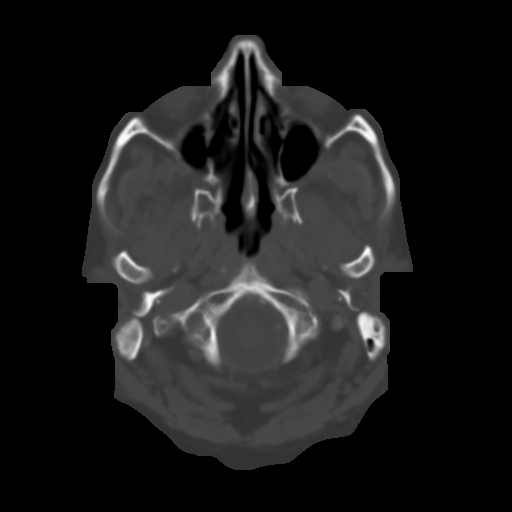
[im 4/30  brain]
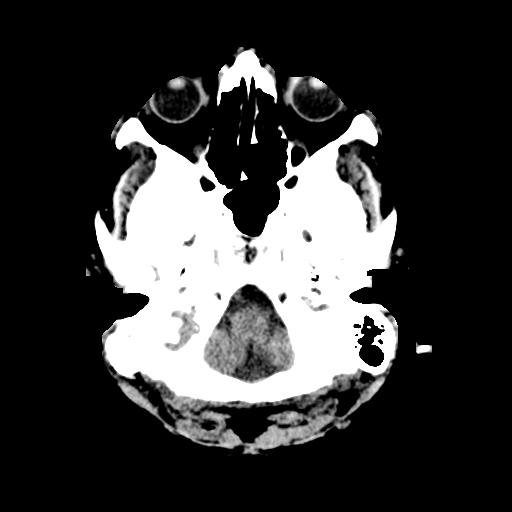
[im 6/30  brain]
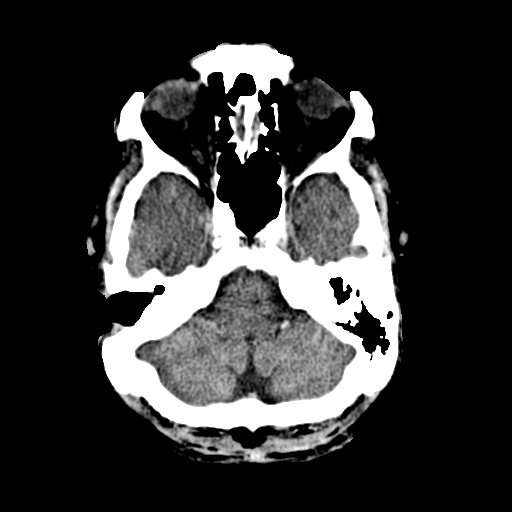
[im 8/30  brain]
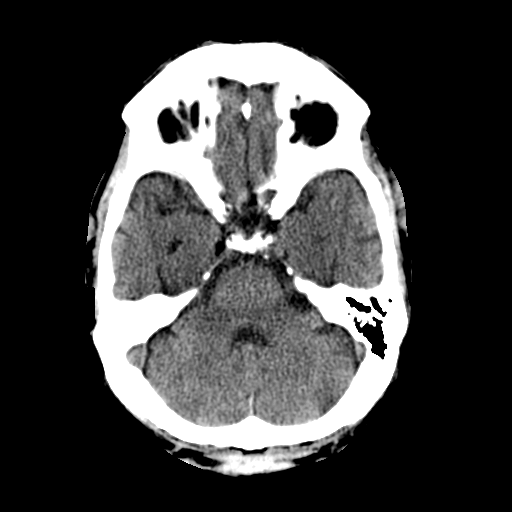
[im 9/30  brain]
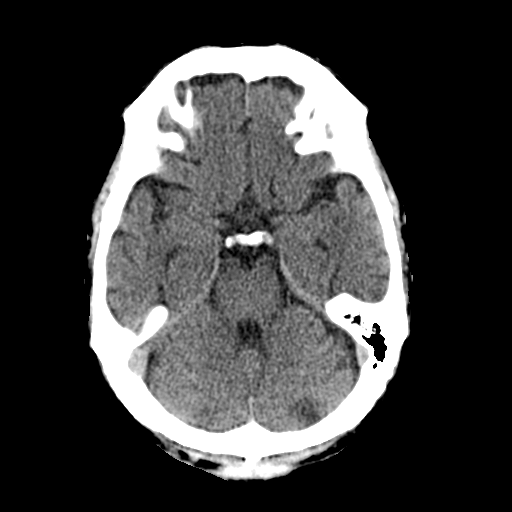
[im 9/30  bone]
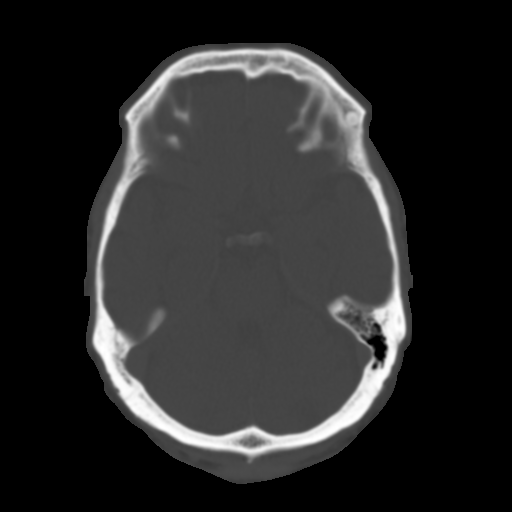
[im 11/30  brain]
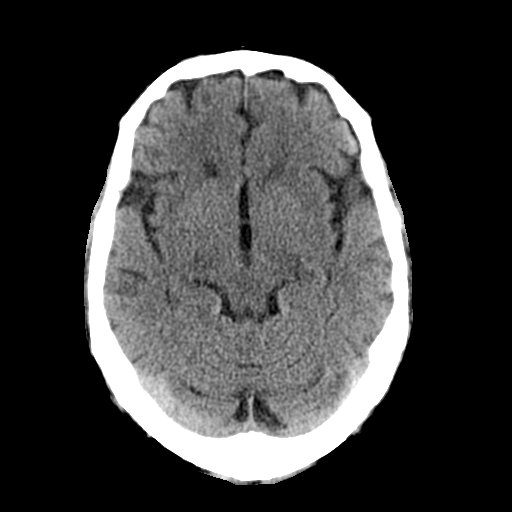
[im 13/30  brain]
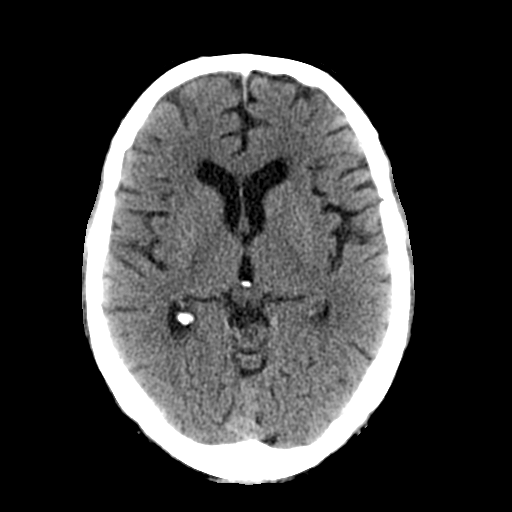
[im 15/30  brain]
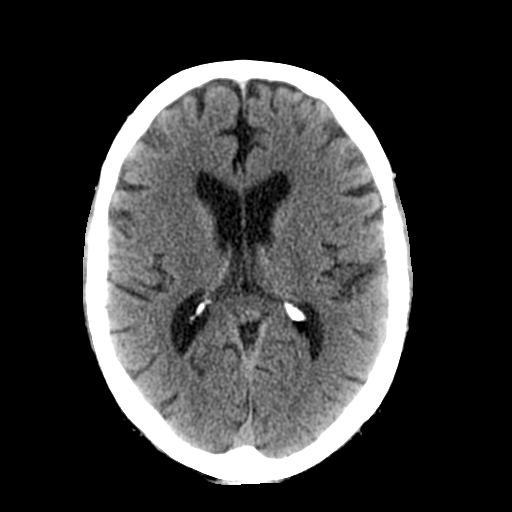
[im 16/30  brain]
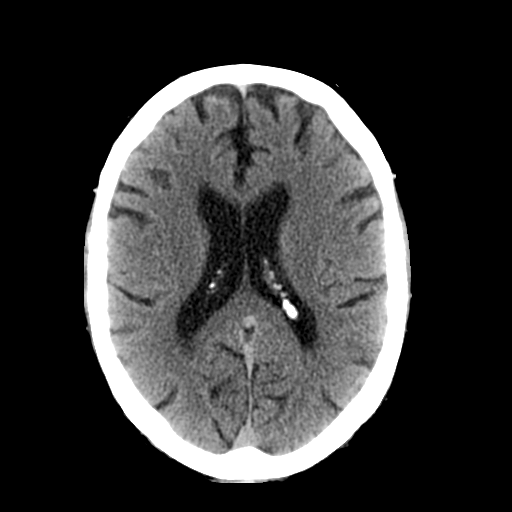
[im 16/30  bone]
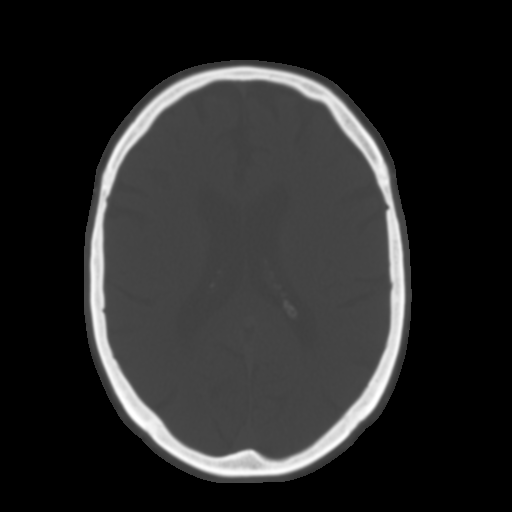
[im 18/30  brain]
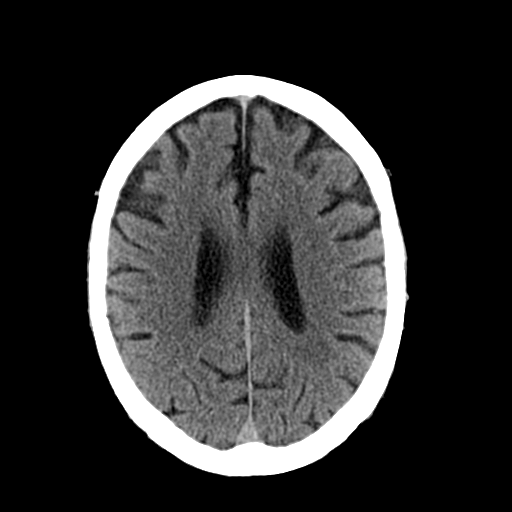
[im 20/30  brain]
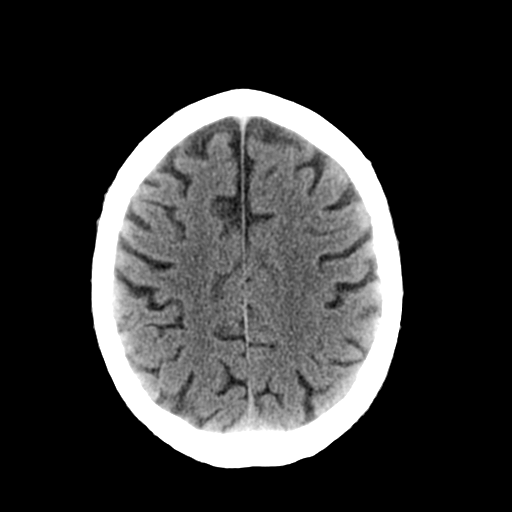
[im 22/30  brain]
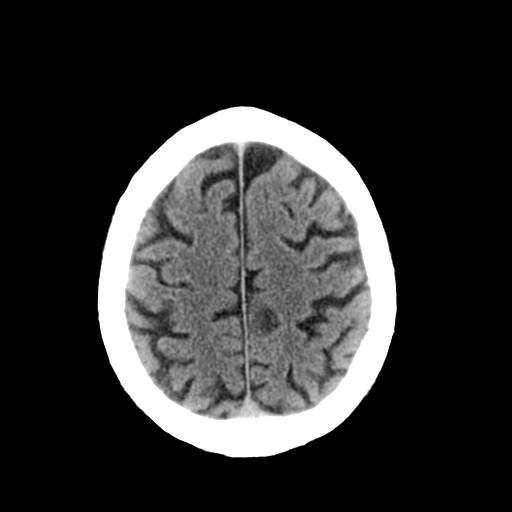
[im 23/30  brain]
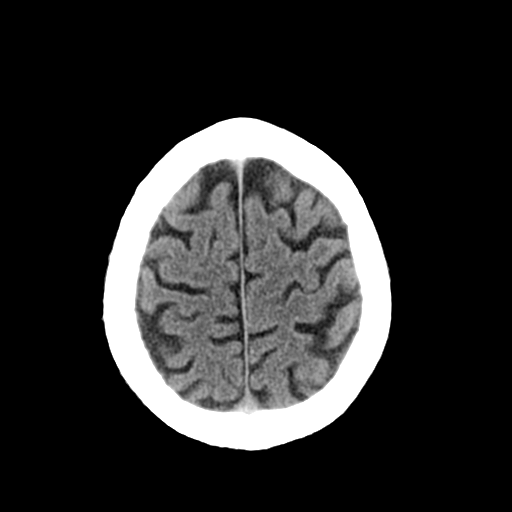
[im 23/30  bone]
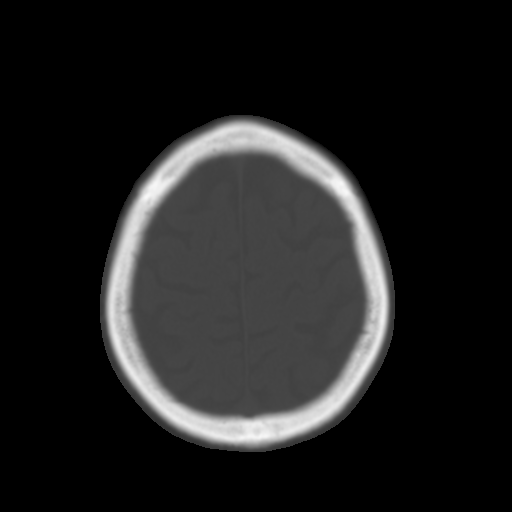
[im 25/30  brain]
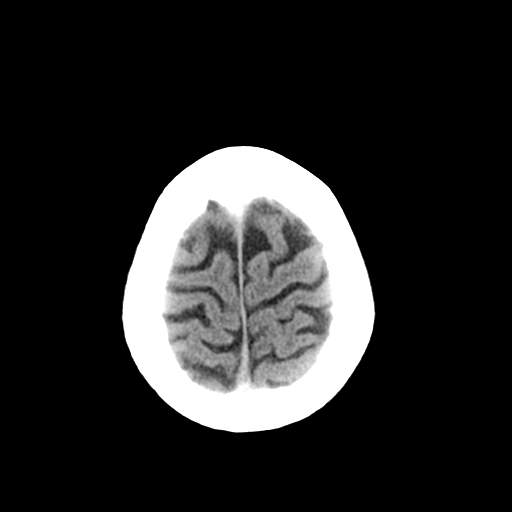
[im 27/30  brain]
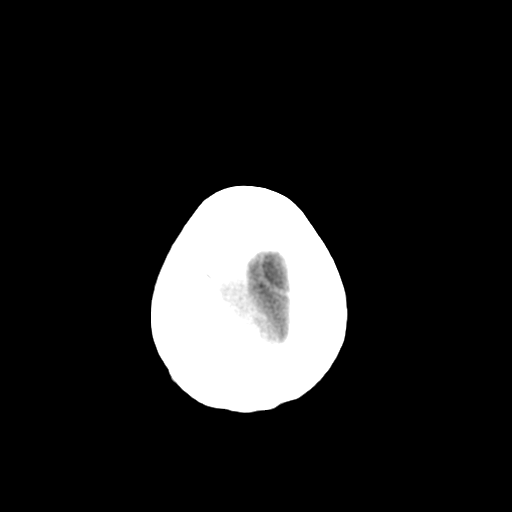
[im 29/30  brain]
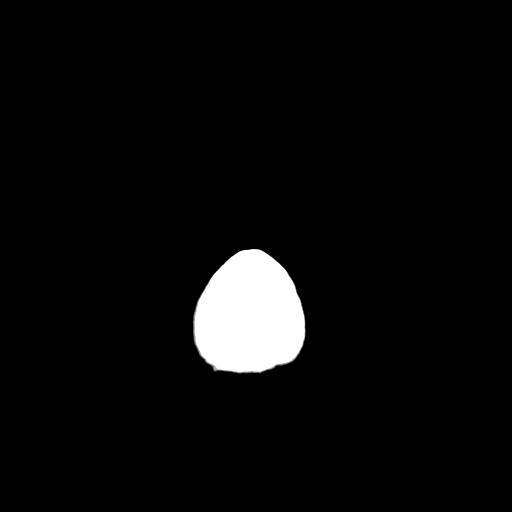

[16 of 30 positions shown; findings below may reference images not displayed]

FINDINGS: The brain stem, cerebellum, cerebral peduncles, thalami,
basal ganglia, basilar cisterns, and ventricular system appear
unremarkable.

No intracranial hemorrhage, mass lesion, or acute infarction is
identified.

Prior right mastoidectomy noted, with chronic thickening along the
right tympanic membrane.
IMPRESSION: 1.  No acute intracranial findings to explain the patient's
syncope.
2.  Stable appearance of right mastoidectomy and chronic thickening
along the right tympanic membrane.

## 2014-10-07 ENCOUNTER — Encounter (HOSPITAL_COMMUNITY): Payer: Self-pay | Admitting: Internal Medicine
